# Patient Record
Sex: Male | Born: 1950 | Race: White | Hispanic: No | Marital: Married | Smoking: Never smoker
Health system: Southern US, Community
[De-identification: ages and names within clinical notes are randomized; demographics above are authoritative.]

## PROBLEM LIST (undated history)

## (undated) DIAGNOSIS — M1990 Unspecified osteoarthritis, unspecified site: Secondary | ICD-10-CM

## (undated) DIAGNOSIS — M109 Gout, unspecified: Secondary | ICD-10-CM

## (undated) DIAGNOSIS — E7800 Pure hypercholesterolemia, unspecified: Secondary | ICD-10-CM

## (undated) DIAGNOSIS — I10 Essential (primary) hypertension: Secondary | ICD-10-CM

## (undated) HISTORY — PX: VASECTOMY: SHX75

## (undated) HISTORY — PX: COLONOSCOPY: SHX174

---

## 2004-02-27 ENCOUNTER — Ambulatory Visit (HOSPITAL_COMMUNITY): Admission: RE | Admit: 2004-02-27 | Discharge: 2004-02-27 | Payer: Self-pay | Admitting: Internal Medicine

## 2009-11-29 ENCOUNTER — Ambulatory Visit (HOSPITAL_COMMUNITY): Admission: RE | Admit: 2009-11-29 | Discharge: 2009-11-29 | Payer: Self-pay | Admitting: Internal Medicine

## 2010-11-14 NOTE — Op Note (Signed)
NAME:  Anthony English, Anthony English                             ACCOUNT NO.:  [id number]   MEDICAL RECORD NO.:  [id number]                   PATIENT TYPE:  AMB   LOCATION:  DAY                                  FACILITY:  [facility]   PHYSICIAN:  Lionel December, M.D.                 DATE OF BIRTH:  28-Jan-1951   DATE OF PROCEDURE:  02/27/2004  DATE OF DISCHARGE:                                 OPERATIVE REPORT   PROCEDURE:  Total colonoscopy.   INDICATIONS:  Anthony English is a 60 year old Caucasian male who is here for screening  colonoscopy. Family history is negative for colorectal carcinoma. Procedure  and risks were reviewed with the patient and informed consent was obtained.   PREOPERATIVE MEDICATIONS:  Demerol 50 mg IV, Versed 5 mg IV in divided  doses.   FINDINGS:  Procedure performed in endoscopy suite. The patient's vital signs  and O2 saturations were monitored during procedure and remained stable. The  patient was placed in the left lateral position and rectal examination  performed. No abnormality noted on external or digital exam. Olympus video  scope was placed in the rectum and advanced into sigmoid colon and beyond.  Preparation was excellent. Few tiny diverticula were noted at the sigmoid  colon along with a small polyp which was ablated by cold biopsy. Scope was  advanced to the cecum which was identified by appendiceal orifice and  ileocecal valve. Picture taken for the record. As the scope was withdrawn,  colonic mucosa was once again carefully examined, and there were no other  abnormalities. The rectal mucosa similarly was normal. Scope was retroflexed  to examine anorectal junction, and small hemorrhoids were noted below the  dentate line. The scope was straightened and withdrawn. The patient  tolerated the procedure well.   FINAL DIAGNOSES:  1.  Small polyp ablated by cold biopsy from the sigmoid colon.  2.  Few tiny diverticula at the sigmoid colon and external hemorrhoids.   RECOMMENDATIONS:  1.  High fiber diet.  2.  I will be contacting patient with biopsy results and further      recommendations.      ___________________________________________                                            Lionel December, M.D.   NR/MEDQ  D:  02/27/2004  T:  02/27/2004  Job:  [id number]   cc:   Kingsley Callander. Ouida Sills, M.D.  [address]  [place]  [place] [address]  Fax: [phone]

## 2011-08-17 ENCOUNTER — Ambulatory Visit (HOSPITAL_COMMUNITY)
Admission: RE | Admit: 2011-08-17 | Discharge: 2011-08-17 | Disposition: A | Discharge status: Home / Self Care | Payer: BC Managed Care – PPO | Source: Ambulatory Visit | Attending: Internal Medicine | Admitting: Internal Medicine

## 2011-08-17 ENCOUNTER — Other Ambulatory Visit (HOSPITAL_COMMUNITY): Payer: Self-pay | Admitting: Internal Medicine

## 2011-08-17 DIAGNOSIS — Z77090 Contact with and (suspected) exposure to asbestos: Secondary | ICD-10-CM

## 2011-08-17 DIAGNOSIS — R918 Other nonspecific abnormal finding of lung field: Secondary | ICD-10-CM | POA: Insufficient documentation

## 2015-02-18 ENCOUNTER — Ambulatory Visit (HOSPITAL_COMMUNITY)
Admission: RE | Admit: 2015-02-18 | Discharge: 2015-02-18 | Disposition: A | Discharge status: Home / Self Care | Payer: Medicare Other | Source: Ambulatory Visit | Attending: Internal Medicine | Admitting: Internal Medicine

## 2015-02-18 ENCOUNTER — Other Ambulatory Visit (HOSPITAL_COMMUNITY): Payer: Self-pay | Admitting: Internal Medicine

## 2015-02-18 DIAGNOSIS — M25552 Pain in left hip: Secondary | ICD-10-CM | POA: Diagnosis not present

## 2015-02-18 DIAGNOSIS — R1032 Left lower quadrant pain: Secondary | ICD-10-CM

## 2015-02-18 DIAGNOSIS — R1030 Lower abdominal pain, unspecified: Secondary | ICD-10-CM | POA: Diagnosis present

## 2015-06-19 ENCOUNTER — Other Ambulatory Visit (HOSPITAL_COMMUNITY): Payer: Self-pay | Admitting: Internal Medicine

## 2015-06-19 DIAGNOSIS — R310 Gross hematuria: Secondary | ICD-10-CM

## 2015-06-20 ENCOUNTER — Ambulatory Visit (HOSPITAL_COMMUNITY)
Admission: RE | Admit: 2015-06-20 | Discharge: 2015-06-20 | Disposition: A | Discharge status: Home / Self Care | Payer: Medicare Other | Source: Ambulatory Visit | Attending: Internal Medicine | Admitting: Internal Medicine

## 2015-06-20 DIAGNOSIS — K5730 Diverticulosis of large intestine without perforation or abscess without bleeding: Secondary | ICD-10-CM | POA: Insufficient documentation

## 2015-06-20 DIAGNOSIS — K760 Fatty (change of) liver, not elsewhere classified: Secondary | ICD-10-CM | POA: Diagnosis not present

## 2015-06-20 DIAGNOSIS — R310 Gross hematuria: Secondary | ICD-10-CM | POA: Diagnosis not present

## 2015-06-20 DIAGNOSIS — N400 Enlarged prostate without lower urinary tract symptoms: Secondary | ICD-10-CM | POA: Insufficient documentation

## 2015-06-20 LAB — POCT I-STAT CREATININE: Creatinine, Ser: 1 mg/dL (ref 0.61–1.24)

## 2015-06-20 MED ORDER — IOHEXOL 300 MG/ML  SOLN
150.0000 mL | Freq: Once | INTRAMUSCULAR | Status: AC | PRN
  Administered 2015-06-20: 150 mL via INTRAVENOUS

## 2015-12-19 ENCOUNTER — Encounter (INDEPENDENT_AMBULATORY_CARE_PROVIDER_SITE_OTHER): Payer: Self-pay | Admitting: *Deleted

## 2016-01-02 ENCOUNTER — Encounter (INDEPENDENT_AMBULATORY_CARE_PROVIDER_SITE_OTHER): Payer: Self-pay | Admitting: *Deleted

## 2016-01-02 ENCOUNTER — Encounter (INDEPENDENT_AMBULATORY_CARE_PROVIDER_SITE_OTHER): Payer: Self-pay

## 2016-01-03 ENCOUNTER — Other Ambulatory Visit (INDEPENDENT_AMBULATORY_CARE_PROVIDER_SITE_OTHER): Payer: Self-pay | Admitting: *Deleted

## 2016-01-03 DIAGNOSIS — Z86010 Personal history of colonic polyps: Secondary | ICD-10-CM

## 2016-03-05 ENCOUNTER — Telehealth (INDEPENDENT_AMBULATORY_CARE_PROVIDER_SITE_OTHER): Payer: Self-pay | Admitting: *Deleted

## 2016-03-05 ENCOUNTER — Encounter (INDEPENDENT_AMBULATORY_CARE_PROVIDER_SITE_OTHER): Payer: Self-pay | Admitting: *Deleted

## 2016-03-05 NOTE — Telephone Encounter (Signed)
Patient needs trilyte 

## 2016-03-05 NOTE — Telephone Encounter (Signed)
Referring MD/PCP: fagan   Procedure: tcs  Reason/Indication:  Hx polyps  Has patient had this procedure before?  Yes, 2010 -- scanned   If so, when, by whom and where?    Is there a family history of colon cancer?  no  Who?  What age when diagnosed?    Is patient diabetic?   no      Does patient have prosthetic heart valve or mechanical valve?  no  Do you have a pacemaker?  no  Has patient ever had endocarditis? no  Has patient had joint replacement within last 12 months?  no  Does patient tend to be constipated or take laxatives? no  Does patient have a history of alcohol/drug use?  no  Is patient on Coumadin, Plavix and/or Aspirin? no  Medications: lisinopril 20 mg 1/2 tab daily, allopurinol 300 mg 1 1/2 tab daily, pravastatin 40 mg daily  Allergies: nkda  Medication Adjustment:   Procedure date & time: 04/02/16 at 1230

## 2016-03-06 MED ORDER — PEG 3350-KCL-NA BICARB-NACL 420 G PO SOLR: 4000.0000 mL | Freq: Once | ORAL | 0 refills | Status: AC

## 2016-03-06 NOTE — Telephone Encounter (Signed)
agree

## 2016-04-02 ENCOUNTER — Ambulatory Visit (HOSPITAL_COMMUNITY)
Admission: RE | Admit: 2016-04-02 | Discharge: 2016-04-02 | Disposition: A | Discharge status: Home / Self Care | Payer: Medicare Other | Source: Ambulatory Visit | Attending: Internal Medicine | Admitting: Internal Medicine

## 2016-04-02 ENCOUNTER — Encounter (HOSPITAL_COMMUNITY): Payer: Self-pay | Admitting: *Deleted

## 2016-04-02 ENCOUNTER — Encounter (HOSPITAL_COMMUNITY)
Admission: RE | Disposition: A | Discharge status: Home / Self Care | Payer: Self-pay | Source: Ambulatory Visit | Attending: Internal Medicine

## 2016-04-02 DIAGNOSIS — K644 Residual hemorrhoidal skin tags: Secondary | ICD-10-CM | POA: Diagnosis not present

## 2016-04-02 DIAGNOSIS — Z09 Encounter for follow-up examination after completed treatment for conditions other than malignant neoplasm: Secondary | ICD-10-CM | POA: Diagnosis not present

## 2016-04-02 DIAGNOSIS — Z1211 Encounter for screening for malignant neoplasm of colon: Secondary | ICD-10-CM | POA: Diagnosis present

## 2016-04-02 DIAGNOSIS — E7800 Pure hypercholesterolemia, unspecified: Secondary | ICD-10-CM | POA: Insufficient documentation

## 2016-04-02 DIAGNOSIS — D125 Benign neoplasm of sigmoid colon: Secondary | ICD-10-CM | POA: Diagnosis not present

## 2016-04-02 DIAGNOSIS — Z86010 Personal history of colonic polyps: Secondary | ICD-10-CM | POA: Insufficient documentation

## 2016-04-02 DIAGNOSIS — D123 Benign neoplasm of transverse colon: Secondary | ICD-10-CM | POA: Insufficient documentation

## 2016-04-02 DIAGNOSIS — K5730 Diverticulosis of large intestine without perforation or abscess without bleeding: Secondary | ICD-10-CM | POA: Diagnosis not present

## 2016-04-02 DIAGNOSIS — I10 Essential (primary) hypertension: Secondary | ICD-10-CM | POA: Insufficient documentation

## 2016-04-02 DIAGNOSIS — K648 Other hemorrhoids: Secondary | ICD-10-CM

## 2016-04-02 HISTORY — DX: Pure hypercholesterolemia, unspecified: E78.00

## 2016-04-02 HISTORY — DX: Gout, unspecified: M10.9

## 2016-04-02 HISTORY — DX: Essential (primary) hypertension: I10

## 2016-04-02 HISTORY — PX: COLONOSCOPY: SHX5424

## 2016-04-02 SURGERY — COLONOSCOPY
Anesthesia: Moderate Sedation

## 2016-04-02 MED ORDER — MIDAZOLAM HCL 5 MG/5ML IJ SOLN
INTRAMUSCULAR | Status: DC | PRN
  Administered 2016-04-02 (×3): 2 mg via INTRAVENOUS

## 2016-04-02 MED ORDER — MEPERIDINE HCL 50 MG/ML IJ SOLN
INTRAMUSCULAR | Status: AC
  Filled 2016-04-02: qty 1

## 2016-04-02 MED ORDER — SODIUM CHLORIDE 0.9 % IV SOLN
INTRAVENOUS | Status: DC
  Administered 2016-04-02: 12:00:00 via INTRAVENOUS

## 2016-04-02 MED ORDER — MIDAZOLAM HCL 5 MG/5ML IJ SOLN
INTRAMUSCULAR | Status: DC
Start: 2016-04-02 — End: 2016-04-02
  Filled 2016-04-02: qty 10

## 2016-04-02 MED ORDER — MEPERIDINE HCL 50 MG/ML IJ SOLN
INTRAMUSCULAR | Status: DC | PRN
  Administered 2016-04-02 (×2): 25 mg via INTRAVENOUS

## 2016-04-02 NOTE — Discharge Instructions (Signed)
No Aspirin or NSAIDs for 3 days. Resume usual medications and high fiber diet. No driving for 24 hours. Physician will call with biopsy results    Colonoscopy, Care After These instructions give you information on caring for yourself after your procedure. Your doctor may also give you more specific instructions. Call your doctor if you have any problems or questions after your procedure. HOME CARE  Do not drive for 24 hours.  Do not sign important papers or use machinery for 24 hours.  You may shower.  You may go back to your usual activities, but go slower for the first 24 hours.  Take rest breaks often during the first 24 hours.  Walk around or use warm packs on your belly (abdomen) if you have belly cramping or gas.  Drink enough fluids to keep your pee (urine) clear or pale yellow.  Resume your normal diet. Avoid heavy or fried foods.  Avoid drinking alcohol for 24 hours or as told by your doctor.  Only take medicines as told by your doctor. If a tissue sample (biopsy) was taken during the procedure:   Do not take aspirin or blood thinners for 7 days, or as told by your doctor.  Do not drink alcohol for 7 days, or as told by your doctor.  Eat soft foods for the first 24 hours. GET HELP IF: You still have a small amount of blood in your poop (stool) 2-3 days after the procedure. GET HELP RIGHT AWAY IF:  You have more than a small amount of blood in your poop.  You see clumps of tissue (blood clots) in your poop.  Your belly is puffy (swollen).  You feel sick to your stomach (nauseous) or throw up (vomit).  You have a fever.  You have belly pain that gets worse and medicine does not help. MAKE SURE YOU:  Understand these instructions.  Will watch your condition.  Will get help right away if you are not doing well or get worse.   This information is not intended to replace advice given to you by your health care provider. Make sure you discuss any  questions you have with your health care provider.   Document Released: 07/18/2010 Document Revised: 06/20/2013 Document Reviewed: 02/20/2013 Elsevier Interactive Patient Education 2016 [employer]  Colon Polyps Polyps are lumps of extra tissue growing inside the body. Polyps can grow in the large intestine (colon). Most colon polyps are noncancerous (benign). However, some colon polyps can become cancerous over time. Polyps that are larger than a pea may be harmful. To be safe, caregivers remove and test all polyps. CAUSES  Polyps form when mutations in the genes cause your cells to grow and divide even though no more tissue is needed. RISK FACTORS There are a number of risk factors that can increase your chances of getting colon polyps. They include:  Being older than 50 years.  Family history of colon polyps or colon cancer.  Long-term colon diseases, such as colitis or Crohn disease.  Being overweight.  Smoking.  Being inactive.  Drinking too much alcohol. SYMPTOMS  Most small polyps do not cause symptoms. If symptoms are present, they may include:  Blood in the stool. The stool may look dark red or black.  Constipation or diarrhea that lasts longer than 1 week. DIAGNOSIS People often do not know they have polyps until their caregiver finds them during a regular checkup. Your caregiver can use 4 tests to check for polyps:  Digital rectal exam.  The caregiver wears gloves and feels inside the rectum. This test would find polyps only in the rectum.  Barium enema. The caregiver puts a liquid called barium into your rectum before taking X-rays of your colon. Barium makes your colon look white. Polyps are dark, so they are easy to see in the X-ray pictures.  Sigmoidoscopy. A thin, flexible tube (sigmoidoscope) is placed into your rectum. The sigmoidoscope has a light and tiny camera in it. The caregiver uses the sigmoidoscope to look at the last third of your  colon.  Colonoscopy. This test is like sigmoidoscopy, but the caregiver looks at the entire colon. This is the most common method for finding and removing polyps. TREATMENT  Any polyps will be removed during a sigmoidoscopy or colonoscopy. The polyps are then tested for cancer. PREVENTION  To help lower your risk of getting more colon polyps:  Eat plenty of fruits and vegetables. Avoid eating fatty foods.  Do not smoke.  Avoid drinking alcohol.  Exercise every day.  Lose weight if recommended by your caregiver.  Eat plenty of calcium and folate. Foods that are rich in calcium include milk, cheese, and broccoli. Foods that are rich in folate include chickpeas, kidney beans, and spinach. HOME CARE INSTRUCTIONS Keep all follow-up appointments as directed by your caregiver. You may need periodic exams to check for polyps. SEEK MEDICAL CARE IF: You notice bleeding during a bowel movement.   This information is not intended to replace advice given to you by your health care provider. Make sure you discuss any questions you have with your health care provider.   Document Released: 03/11/2004 Document Revised: 07/06/2014 Document Reviewed: 08/25/2011 Elsevier Interactive Patient Education 2016 [employer].   Hemorrhoids Hemorrhoids are swollen veins around the rectum or anus. There are two types of hemorrhoids:   Internal hemorrhoids. These occur in the veins just inside the rectum. They may poke through to the outside and become irritated and painful.  External hemorrhoids. These occur in the veins outside the anus and can be felt as a painful swelling or hard lump near the anus. CAUSES  Pregnancy.   Obesity.   Constipation or diarrhea.   Straining to have a bowel movement.   Sitting for long periods on the toilet.  Heavy lifting or other activity that caused you to strain.  Anal intercourse. SYMPTOMS   Pain.   Anal itching or irritation.   Rectal bleeding.    Fecal leakage.   Anal swelling.   One or more lumps around the anus.  DIAGNOSIS  Your caregiver may be able to diagnose hemorrhoids by visual examination. Other examinations or tests that may be performed include:   Examination of the rectal area with a gloved hand (digital rectal exam).   Examination of anal canal using a small tube (scope).   A blood test if you have lost a significant amount of blood.  A test to look inside the colon (sigmoidoscopy or colonoscopy). TREATMENT Most hemorrhoids can be treated at home. However, if symptoms do not seem to be getting better or if you have a lot of rectal bleeding, your caregiver may perform a procedure to help make the hemorrhoids get smaller or remove them completely. Possible treatments include:   Placing a rubber band at the base of the hemorrhoid to cut off the circulation (rubber band ligation).   Injecting a chemical to shrink the hemorrhoid (sclerotherapy).   Using a tool to burn the hemorrhoid (infrared light therapy).   Surgically removing  the hemorrhoid (hemorrhoidectomy).   Stapling the hemorrhoid to block blood flow to the tissue (hemorrhoid stapling).  HOME CARE INSTRUCTIONS   Eat foods with fiber, such as whole grains, beans, nuts, fruits, and vegetables. Ask your doctor about taking products with added fiber in them (fibersupplements).  Increase fluid intake. Drink enough water and fluids to keep your urine clear or pale yellow.   Exercise regularly.   Go to the bathroom when you have the urge to have a bowel movement. Do not wait.   Avoid straining to have bowel movements.   Keep the anal area dry and clean. Use wet toilet paper or moist towelettes after a bowel movement.   Medicated creams and suppositories may be used or applied as directed.   Only take over-the-counter or prescription medicines as directed by your caregiver.   Take warm sitz baths for 15-20 minutes, 3-4 times a day  to ease pain and discomfort.   Place ice packs on the hemorrhoids if they are tender and swollen. Using ice packs between sitz baths may be helpful.   Put ice in a plastic bag.   Place a towel between your skin and the bag.   Leave the ice on for 15-20 minutes, 3-4 times a day.   Do not use a donut-shaped pillow or sit on the toilet for long periods. This increases blood pooling and pain.  SEEK MEDICAL CARE IF:  You have increasing pain and swelling that is not controlled by treatment or medicine.  You have uncontrolled bleeding.  You have difficulty or you are unable to have a bowel movement.  You have pain or inflammation outside the area of the hemorrhoids. MAKE SURE YOU:  Understand these instructions.  Will watch your condition.  Will get help right away if you are not doing well or get worse.   This information is not intended to replace advice given to you by your health care provider. Make sure you discuss any questions you have with your health care provider.   Document Released: 06/12/2000 Document Revised: 06/01/2012 Document Reviewed: 04/19/2012 Elsevier Interactive Patient Education 2016 [employer].    Diverticulosis Diverticulosis is the condition that develops when small pouches (diverticula) form in the wall of your colon. Your colon, or large intestine, is where water is absorbed and stool is formed. The pouches form when the inside layer of your colon pushes through weak spots in the outer layers of your colon. CAUSES  No one knows exactly what causes diverticulosis. RISK FACTORS  Being older than 65. Your risk for this condition increases with age. Diverticulosis is rare in people younger than 40 years. By age 61, almost everyone has it.  Eating a low-fiber diet.  Being frequently constipated.  Being overweight.  Not getting enough exercise.  Smoking.  Taking over-the-counter pain medicines, like aspirin and ibuprofen. SYMPTOMS  Most  people with diverticulosis do not have symptoms. DIAGNOSIS  Because diverticulosis often has no symptoms, health care providers often discover the condition during an exam for other colon problems. In many cases, a health care provider will diagnose diverticulosis while using a flexible scope to examine the colon (colonoscopy). TREATMENT  If you have never developed an infection related to diverticulosis, you may not need treatment. If you have had an infection before, treatment may include:  Eating more fruits, vegetables, and grains.  Taking a fiber supplement.  Taking a live bacteria supplement (probiotic).  Taking medicine to relax your colon. HOME CARE INSTRUCTIONS   Drink  at least 6-8 glasses of water each day to prevent constipation.  Try not to strain when you have a bowel movement.  Keep all follow-up appointments. If you have had an infection before:  Increase the fiber in your diet as directed by your health care provider or dietitian.  Take a dietary fiber supplement if your health care provider approves.  Only take medicines as directed by your health care provider. SEEK MEDICAL CARE IF:   You have abdominal pain.  You have bloating.  You have cramps.  You have not gone to the bathroom in 3 days. SEEK IMMEDIATE MEDICAL CARE IF:   Your pain gets worse.  Yourbloating becomes very bad.  You have a fever or chills, and your symptoms suddenly get worse.  You begin vomiting.  You have bowel movements that are bloody or black. MAKE SURE YOU:  Understand these instructions.  Will watch your condition.  Will get help right away if you are not doing well or get worse.   This information is not intended to replace advice given to you by your health care provider. Make sure you discuss any questions you have with your health care provider.   Document Released: 03/12/2004 Document Revised: 06/20/2013 Document Reviewed: 05/10/2013 Elsevier Interactive  Patient Education [employer].

## 2016-04-02 NOTE — H&P (Signed)
Anthony English is an 65 y.o. male.   Chief Complaint: Patient is  here for colonoscopy. HPI: Patient is 66 year old Caucasian male with history of colonic adenomas and is here for surveillance colonoscopy. Last exam was about 7 years ago. He denies abdominal pain change in bowel habits or rectal bleeding. History is negative for CRC.  Past Medical History:  Diagnosis Date  . Gout   . Hypercholesteremia   . Hypertension     Past Surgical History:  Procedure Laterality Date  . COLONOSCOPY    . COLONOSCOPY    . VASECTOMY      Family History  Problem Relation Age of Onset  . Colon cancer Neg Hx    Social History:  reports that he has never smoked. He quit smokeless tobacco use about 26 years ago. He reports that he drinks alcohol. He reports that he does not use drugs.  Allergies: No Known Allergies  Medications Prior to Admission  Medication Sig Dispense Refill  . allopurinol (ZYLOPRIM) 100 MG tablet Take 100 mg by mouth daily.    Marland Kitchen lisinopril (PRINIVIL,ZESTRIL) 10 MG tablet Take 10 mg by mouth daily.    . pravastatin (PRAVACHOL) 20 MG tablet Take 20 mg by mouth daily.      No results found for this or any previous visit (from the past 48 hour(s)). No results found.  ROS  Blood pressure 114/81, pulse 75, temperature 98 F (36.7 C), temperature source Oral, resp. rate 18, height 6' (1.829 m), weight 241 lb (109.3 kg), SpO2 93 %. Physical Exam  Constitutional: He appears well-developed and well-nourished.  HENT:  Mouth/Throat: Oropharynx is clear and moist.  Eyes: Conjunctivae are normal. No scleral icterus.  Neck: No thyromegaly present.  Cardiovascular: Normal rate, regular rhythm and normal heart sounds.   No murmur heard. Respiratory: Effort normal and breath sounds normal.  GI: Soft. He exhibits no distension and no mass. There is no tenderness.  Musculoskeletal: He exhibits no edema.  Lymphadenopathy:    He has no cervical adenopathy.  Neurological: He is alert.   Skin: Skin is warm and dry.     Assessment/Plan History of colonic polyps. Surveillance colonoscopy.  Hildred Laser, MD 04/02/2016, 1:07 PM

## 2016-04-02 NOTE — Op Note (Signed)
[facility] Patient Name: Anthony English Procedure Date: 04/02/2016 12:51 PM MRN: [id number] Date of Birth: 1950-12-17 Attending MD: Hildred Laser , MD CSN: [id number] Age: 65 Admit Type: Outpatient Procedure:                Colonoscopy Indications:              High risk colon cancer surveillance: Personal                            history of colonic polyps Providers:                Hildred Laser, MD, Jeralyn Bennett, RN, Bonnetta Barry, Technician Referring MD:             Asencion Noble, MD Medicines:                Meperidine 50 mg IV, Midazolam 6 mg IV Complications:            No immediate complications. Estimated Blood Loss:     Estimated blood loss was minimal. Procedure:                Pre-Anesthesia Assessment:                           - Prior to the procedure, a History and Physical                            was performed, and patient medications and                            allergies were reviewed. The patient's tolerance of                            previous anesthesia was also reviewed. The risks                            and benefits of the procedure and the sedation                            options and risks were discussed with the patient.                            All questions were answered, and informed consent                            was obtained. Prior Anticoagulants: The patient has                            taken no previous anticoagulant or antiplatelet                            agents. ASA Grade Assessment: II - A patient with  mild systemic disease. After reviewing the risks                            and benefits, the patient was deemed in                            satisfactory condition to undergo the procedure.                           After obtaining informed consent, the colonoscope                            was passed under direct vision. Throughout the                            procedure,  the patient's blood pressure, pulse, and                            oxygen saturations were monitored continuously. The                            [id number]) scope was introduced through                            the anus and advanced to the the cecum, identified                            by appendiceal orifice and ileocecal valve. The                            colonoscopy was performed without difficulty. The                            patient tolerated the procedure well. The quality                            of the bowel preparation was adequate. The                            ileocecal valve, appendiceal orifice, and rectum                            were photographed. Scope In: 1:14:11 PM Scope Out: 1:47:13 PM Scope Withdrawal Time: 0 hours 22 minutes 35 seconds  Total Procedure Duration: 0 hours 33 minutes 2 seconds  Findings:      Two sessile polyps were found in the transverse colon. The polyps were       small in size. These were biopsied with a cold forceps for histology.       The pathology specimen was placed into Bottle Number 1.      A 7 mm polyp was found in the splenic flexure. The polyp was sessile.       The polyp was removed with a cold snare. Resection and retrieval were       complete. The pathology specimen  was placed into Bottle Number 1.      A small polyp was found in the sigmoid colon. The polyp was sessile. The       polyp was removed with a cold snare. Polyp resection was incomplete. The       resected tissue was retrieved. To close a defect after polypectomy, one       hemostatic clip was successfully placed (MR conditional). There was no       bleeding at the end of the procedure.      Multiple medium-mouthed diverticula were found in the proximal sigmoid       colon, mid sigmoid colon and distal sigmoid colon.      External and internal hemorrhoids were found during retroflexion. The       hemorrhoids were small. Impression:               -  Two small polyps in the transverse colon.                            Biopsied.                           - One 7 mm polyp at the splenic flexure, removed                            with a cold snare. Resected and retrieved.                           - One small polyp in the sigmoid colon, removed                            with a cold snare. Polyp felt to beoverlying                            diverticulum. Incomplete resection. Resected tissue                            retrieved. Clip (MR conditional) was placed.                           - Diverticulosis in the proximal sigmoid colon, in                            the mid sigmoid colon and in the distal sigmoid                            colon.                           - External and internal hemorrhoids. Moderate Sedation:      Moderate (conscious) sedation was administered by the endoscopy nurse       and supervised by the endoscopist. The following parameters were       monitored: oxygen saturation, heart rate, blood pressure, CO2       capnography and response to care. Total physician intraservice time was       37 minutes. Recommendation:           -  Patient has a contact number available for                            emergencies. The signs and symptoms of potential                            delayed complications were discussed with the                            patient. Return to normal activities tomorrow.                            Written discharge instructions were provided to the                            patient.                           - High fiber diet today.                           - Continue present medications.                           - No aspirin, ibuprofen, naproxen, or other                            non-steroidal anti-inflammatory drugs for 3 days                            after polyp removal.                           - Await pathology results.                           - Repeat colonoscopy for  surveillance based on                            pathology results. Procedure Code(s):        --- Professional ---                           [phone], Colonoscopy, flexible; with removal of                            tumor(s), polyp(s), or other lesion(s) by snare                            technique                           [id number], 59, Colonoscopy, flexible; with biopsy,                            single or multiple  [id number], Moderate sedation services provided by the                            same physician or other qualified health care                            professional performing the diagnostic or                            therapeutic service that the sedation supports,                            requiring the presence of an independent trained                            observer to assist in the monitoring of the                            patient's level of consciousness and physiological                            status; initial 15 minutes of intraservice time,                            patient age 50 years or older                           [phone], Moderate sedation services; each additional                            15 minutes intraservice time Diagnosis Code(s):        --- Professional ---                           Z86.010, Personal history of colonic polyps                           D12.3, Benign neoplasm of transverse colon (hepatic                            flexure or splenic flexure)                           D12.5, Benign neoplasm of sigmoid colon                           K64.8, Other hemorrhoids                           K57.30, Diverticulosis of large intestine without                            perforation or abscess without bleeding CPT copyright 2016 American Medical Association. All rights reserved. The codes documented in this report are preliminary and upon coder review may  be revised to meet current compliance  requirements. Hildred Laser, MD Hildred Laser, MD 04/02/2016 1:58:52 PM This report has been signed electronically. Number of Addenda: 0

## 2016-04-07 ENCOUNTER — Encounter (HOSPITAL_COMMUNITY): Payer: Self-pay | Admitting: Internal Medicine

## 2016-04-30 NOTE — Progress Notes (Signed)
Need orders in epic for 12-1- surgery thanks

## 2016-05-11 ENCOUNTER — Other Ambulatory Visit (INDEPENDENT_AMBULATORY_CARE_PROVIDER_SITE_OTHER): Payer: Self-pay | Admitting: Physician Assistant

## 2016-05-19 ENCOUNTER — Encounter (HOSPITAL_COMMUNITY)
Admission: RE | Admit: 2016-05-19 | Discharge: 2016-05-19 | Disposition: A | Discharge status: Home / Self Care | Payer: Medicare Other | Source: Ambulatory Visit | Attending: Orthopaedic Surgery | Admitting: Orthopaedic Surgery

## 2016-05-19 ENCOUNTER — Encounter (HOSPITAL_COMMUNITY): Payer: Self-pay

## 2016-05-19 ENCOUNTER — Encounter (INDEPENDENT_AMBULATORY_CARE_PROVIDER_SITE_OTHER): Payer: Self-pay

## 2016-05-19 DIAGNOSIS — Z01818 Encounter for other preprocedural examination: Secondary | ICD-10-CM | POA: Diagnosis not present

## 2016-05-19 DIAGNOSIS — M1612 Unilateral primary osteoarthritis, left hip: Secondary | ICD-10-CM | POA: Diagnosis not present

## 2016-05-19 DIAGNOSIS — I10 Essential (primary) hypertension: Secondary | ICD-10-CM | POA: Diagnosis not present

## 2016-05-19 HISTORY — DX: Unspecified osteoarthritis, unspecified site: M19.90

## 2016-05-19 LAB — CBC
HCT: 48.3 % (ref 39.0–52.0)
Hemoglobin: 17 g/dL (ref 13.0–17.0)
MCH: 31.6 pg (ref 26.0–34.0)
MCHC: 35.2 g/dL (ref 30.0–36.0)
MCV: 89.8 fL (ref 78.0–100.0)
PLATELETS: 210 10*3/uL (ref 150–400)
RBC: 5.38 MIL/uL (ref 4.22–5.81)
RDW: 13.4 % (ref 11.5–15.5)
WBC: 7.3 10*3/uL (ref 4.0–10.5)

## 2016-05-19 LAB — BASIC METABOLIC PANEL
Anion gap: 7 (ref 5–15)
BUN: 16 mg/dL (ref 6–20)
CHLORIDE: 105 mmol/L (ref 101–111)
CO2: 28 mmol/L (ref 22–32)
CREATININE: 1.13 mg/dL (ref 0.61–1.24)
Calcium: 9.9 mg/dL (ref 8.9–10.3)
GFR calc Af Amer: >60 mL/min (ref >60)
Glucose, Bld: 108 mg/dL — ABNORMAL HIGH (ref 65–99)
Potassium: 5.5 mmol/L — ABNORMAL HIGH (ref 3.5–5.1)
SODIUM: 140 mmol/L (ref 135–145)

## 2016-05-19 LAB — ABO/RH: ABO/RH(D): B POS

## 2016-05-19 LAB — SURGICAL PCR SCREEN
MRSA, PCR: NEGATIVE
Staphylococcus aureus: NEGATIVE

## 2016-05-19 NOTE — Patient Instructions (Addendum)
Anthony English  05/19/2016   Your procedure is scheduled on: 05/29/16  Report to [facility] Main  Entrance take [place]  elevators to 3rd floor to  [place] at [place]  AM.  Call this number if you have problems the morning of surgery [phone]   Remember: ONLY 1 PERSON MAY GO WITH YOU TO SHORT STAY TO GET  READY MORNING OF YOUR SURGERY.  Do not eat food or drink liquids :After Midnight.     Take these medicines the morning of surgery with A SIP OF WATER:Allurinol  DO NOT TAKE ANY DIABETIC MEDICATIONS DAY OF YOUR SURGERY                               You may not have any metal on your body including hair pins and              piercings  Do not wear jewelry, make-up, lotions, powders or perfumes, deodorant             Do not wear nail polish.  Do not shave  48 hours prior to surgery.              Men may shave face and neck.   Do not bring valuables to the hospital. [place].  Contacts, dentures or bridgework may not be worn into surgery.  Leave suitcase in the car. After surgery it may be brought to your room.               Please read over the following fact sheets you were given: _____________________________________________________________________             [facility] - Preparing for Surgery Before surgery, you can play an important role.  Because skin is not sterile, your skin needs to be as free of germs as possible.  You can reduce the number of germs on your skin by washing with CHG (chlorahexidine gluconate) soap before surgery.  CHG is an antiseptic cleaner which kills germs and bonds with the skin to continue killing germs even after washing. Please DO NOT use if you have an allergy to CHG or antibacterial soaps.  If your skin becomes reddened/irritated stop using the CHG and inform your nurse when you arrive at Short Stay. Do not shave (including legs and underarms) for at least 48 hours  prior to the first CHG shower.  You may shave your face/neck. Please follow these instructions carefully:  1.  Shower with CHG Soap the night before surgery and the  morning of Surgery.  2.  If you choose to wash your hair, wash your hair first as usual with your  normal  shampoo.  3.  After you shampoo, rinse your hair and body thoroughly to remove the  shampoo.                           4.  Use CHG as you would any other liquid soap.  You can apply chg directly  to the skin and wash                       Gently with a scrungie or clean washcloth.  5.  Apply the CHG Soap to your body ONLY FROM THE NECK DOWN.   Do not use on face/ open                           Wound or open sores. Avoid contact with eyes, ears mouth and genitals (private parts).                       Wash face,  Genitals (private parts) with your normal soap.             6.  Wash thoroughly, paying special attention to the area where your surgery  will be performed.  7.  Thoroughly rinse your body with warm water from the neck down.  8.  DO NOT shower/wash with your normal soap after using and rinsing off  the CHG Soap.                9.  Pat yourself dry with a clean towel.            10.  Wear clean pajamas.            11.  Place clean sheets on your bed the night of your first shower and do not  sleep with pets. Day of Surgery : Do not apply any lotions/deodorants the morning of surgery.  Please wear clean clothes to the hospital/surgery center.  FAILURE TO FOLLOW THESE INSTRUCTIONS MAY RESULT IN THE CANCELLATION OF YOUR SURGERY PATIENT SIGNATURE_________________________________  NURSE SIGNATURE__________________________________  ________________________________________________________________________   Adam Phenix  An incentive spirometer is a tool that can help keep your lungs clear and active. This tool measures how well you are filling your lungs with each breath. Taking long deep breaths may help reverse  or decrease the chance of developing breathing (pulmonary) problems (especially infection) following:  A long period of time when you are unable to move or be active. BEFORE THE PROCEDURE   If the spirometer includes an indicator to show your best effort, your nurse or respiratory therapist will set it to a desired goal.  If possible, sit up straight or lean slightly forward. Try not to slouch.  Hold the incentive spirometer in an upright position. INSTRUCTIONS FOR USE  1. Sit on the edge of your bed if possible, or sit up as far as you can in bed or on a chair. 2. Hold the incentive spirometer in an upright position. 3. Breathe out normally. 4. Place the mouthpiece in your mouth and seal your lips tightly around it. 5. Breathe in slowly and as deeply as possible, raising the piston or the ball toward the top of the column. 6. Hold your breath for 3-5 seconds or for as long as possible. Allow the piston or ball to fall to the bottom of the column. 7. Remove the mouthpiece from your mouth and breathe out normally. 8. Rest for a few seconds and repeat Steps 1 through 7 at least 10 times every 1-2 hours when you are awake. Take your time and take a few normal breaths between deep breaths. 9. The spirometer may include an indicator to show your best effort. Use the indicator as a goal to work toward during each repetition. 10. After each set of 10 deep breaths, practice coughing to be sure your lungs are clear. If you have an incision (the cut made at the time of surgery), support your incision when coughing by placing a  pillow or rolled up towels firmly against it. Once you are able to get out of bed, walk around indoors and cough well. You may stop using the incentive spirometer when instructed by your caregiver.  RISKS AND COMPLICATIONS  Take your time so you do not get dizzy or light-headed.  If you are in pain, you may need to take or ask for pain medication before doing incentive  spirometry. It is harder to take a deep breath if you are having pain. AFTER USE  Rest and breathe slowly and easily.  It can be helpful to keep track of a log of your progress. Your caregiver can provide you with a simple table to help with this. If you are using the spirometer at home, follow these instructions: [place] IF:   You are having difficultly using the spirometer.  You have trouble using the spirometer as often as instructed.  Your pain medication is not giving enough relief while using the spirometer.  You develop fever of 100.5 F (38.1 C) or higher. SEEK IMMEDIATE MEDICAL CARE IF:   You cough up bloody sputum that had not been present before.  You develop fever of 102 F (38.9 C) or greater.  You develop worsening pain at or near the incision site. MAKE SURE YOU:   Understand these instructions.  Will watch your condition.  Will get help right away if you are not doing well or get worse. Document Released: 10/26/2006 Document Revised: 09/07/2011 Document Reviewed: 12/27/2006 [facility] Patient Information 2014 [place], [place].   ________________________________________________________________________

## 2016-05-29 ENCOUNTER — Encounter (HOSPITAL_COMMUNITY)
Admission: RE | Disposition: A | Discharge status: Home Health | Payer: Self-pay | Source: Ambulatory Visit | Attending: Orthopaedic Surgery

## 2016-05-29 ENCOUNTER — Inpatient Hospital Stay (HOSPITAL_COMMUNITY): Payer: Medicare Other

## 2016-05-29 ENCOUNTER — Inpatient Hospital Stay (HOSPITAL_COMMUNITY)
Admission: RE | Admit: 2016-05-29 | Discharge: 2016-05-30 | DRG: 470 | Disposition: A | Discharge status: Home Health | Payer: Medicare Other | Source: Ambulatory Visit | Attending: Orthopaedic Surgery | Admitting: Orthopaedic Surgery

## 2016-05-29 ENCOUNTER — Inpatient Hospital Stay (HOSPITAL_COMMUNITY): Payer: Medicare Other | Admitting: Anesthesiology

## 2016-05-29 ENCOUNTER — Encounter (HOSPITAL_COMMUNITY): Payer: Self-pay | Admitting: *Deleted

## 2016-05-29 DIAGNOSIS — M1612 Unilateral primary osteoarthritis, left hip: Principal | ICD-10-CM

## 2016-05-29 DIAGNOSIS — Z419 Encounter for procedure for purposes other than remedying health state, unspecified: Secondary | ICD-10-CM

## 2016-05-29 DIAGNOSIS — Z6834 Body mass index (BMI) 34.0-34.9, adult: Secondary | ICD-10-CM | POA: Diagnosis not present

## 2016-05-29 DIAGNOSIS — Z79899 Other long term (current) drug therapy: Secondary | ICD-10-CM

## 2016-05-29 DIAGNOSIS — Z96642 Presence of left artificial hip joint: Secondary | ICD-10-CM

## 2016-05-29 DIAGNOSIS — Z87891 Personal history of nicotine dependence: Secondary | ICD-10-CM | POA: Diagnosis not present

## 2016-05-29 DIAGNOSIS — E7800 Pure hypercholesterolemia, unspecified: Secondary | ICD-10-CM | POA: Diagnosis present

## 2016-05-29 DIAGNOSIS — I10 Essential (primary) hypertension: Secondary | ICD-10-CM | POA: Diagnosis present

## 2016-05-29 DIAGNOSIS — M25552 Pain in left hip: Secondary | ICD-10-CM | POA: Diagnosis present

## 2016-05-29 HISTORY — PX: TOTAL HIP ARTHROPLASTY: SHX124

## 2016-05-29 LAB — TYPE AND SCREEN
ABO/RH(D): B POS
ANTIBODY SCREEN: NEGATIVE

## 2016-05-29 SURGERY — ARTHROPLASTY, HIP, TOTAL, ANTERIOR APPROACH
Anesthesia: Spinal | Site: Hip | Laterality: Left

## 2016-05-29 MED ORDER — SODIUM CHLORIDE 0.9 % IR SOLN
Status: DC | PRN
  Administered 2016-05-29: 1000 mL

## 2016-05-29 MED ORDER — LACTATED RINGERS IV SOLN
INTRAVENOUS | Status: DC
  Administered 2016-05-29 (×2): via INTRAVENOUS

## 2016-05-29 MED ORDER — BUPIVACAINE HCL (PF) 0.5 % IJ SOLN
INTRAMUSCULAR | Status: DC | PRN
  Administered 2016-05-29: 15 mg via INTRATHECAL

## 2016-05-29 MED ORDER — PHENOL 1.4 % MT LIQD: 1.0000 | OROMUCOSAL | Status: DC | PRN

## 2016-05-29 MED ORDER — CEFAZOLIN IN D5W 1 GM/50ML IV SOLN
1.0000 g | Freq: Four times a day (QID) | INTRAVENOUS | Status: AC
  Administered 2016-05-29 (×2): 1 g via INTRAVENOUS
  Filled 2016-05-29 (×3): qty 50

## 2016-05-29 MED ORDER — OXYCODONE HCL 5 MG PO TABS: 5.0000 mg | ORAL_TABLET | Freq: Once | ORAL | Status: DC | PRN

## 2016-05-29 MED ORDER — METOCLOPRAMIDE HCL 5 MG/ML IJ SOLN: 5.0000 mg | Freq: Three times a day (TID) | INTRAMUSCULAR | Status: DC | PRN

## 2016-05-29 MED ORDER — DEXAMETHASONE SODIUM PHOSPHATE 10 MG/ML IJ SOLN
INTRAMUSCULAR | Status: AC
  Filled 2016-05-29: qty 1

## 2016-05-29 MED ORDER — BUPIVACAINE HCL (PF) 0.5 % IJ SOLN
INTRAMUSCULAR | Status: AC
Start: 2016-05-29 — End: 2016-05-29
  Filled 2016-05-29: qty 30

## 2016-05-29 MED ORDER — PRAVASTATIN SODIUM 20 MG PO TABS
40.0000 mg | ORAL_TABLET | Freq: Every day | ORAL | Status: DC
  Administered 2016-05-30: 40 mg via ORAL
  Filled 2016-05-29: qty 2

## 2016-05-29 MED ORDER — METOCLOPRAMIDE HCL 5 MG PO TABS: 5.0000 mg | ORAL_TABLET | Freq: Three times a day (TID) | ORAL | Status: DC | PRN

## 2016-05-29 MED ORDER — MIDAZOLAM HCL 2 MG/2ML IJ SOLN
INTRAMUSCULAR | Status: AC
  Filled 2016-05-29: qty 2

## 2016-05-29 MED ORDER — POLYETHYLENE GLYCOL 3350 17 G PO PACK: 17.0000 g | PACK | Freq: Every day | ORAL | Status: DC | PRN

## 2016-05-29 MED ORDER — ALUM & MAG HYDROXIDE-SIMETH 200-200-20 MG/5ML PO SUSP: 30.0000 mL | ORAL | Status: DC | PRN

## 2016-05-29 MED ORDER — ONDANSETRON HCL 4 MG/2ML IJ SOLN: 4.0000 mg | Freq: Four times a day (QID) | INTRAMUSCULAR | Status: DC | PRN

## 2016-05-29 MED ORDER — TRANEXAMIC ACID 1000 MG/10ML IV SOLN
1000.0000 mg | INTRAVENOUS | Status: AC
  Administered 2016-05-29: 1000 mg via INTRAVENOUS
  Filled 2016-05-29: qty 1100

## 2016-05-29 MED ORDER — HYDROMORPHONE HCL 1 MG/ML IJ SOLN
1.0000 mg | INTRAMUSCULAR | Status: DC | PRN
  Administered 2016-05-29: 1 mg via INTRAVENOUS
  Filled 2016-05-29: qty 1

## 2016-05-29 MED ORDER — MIDAZOLAM HCL 5 MG/5ML IJ SOLN
INTRAMUSCULAR | Status: DC | PRN
  Administered 2016-05-29: 2 mg via INTRAVENOUS

## 2016-05-29 MED ORDER — METHOCARBAMOL 1000 MG/10ML IJ SOLN
500.0000 mg | Freq: Four times a day (QID) | INTRAVENOUS | Status: DC | PRN
  Filled 2016-05-29: qty 5

## 2016-05-29 MED ORDER — DEXAMETHASONE SODIUM PHOSPHATE 10 MG/ML IJ SOLN
INTRAMUSCULAR | Status: DC | PRN
  Administered 2016-05-29: 10 mg via INTRAVENOUS

## 2016-05-29 MED ORDER — ZOLPIDEM TARTRATE 5 MG PO TABS: 5.0000 mg | ORAL_TABLET | Freq: Every evening | ORAL | Status: DC | PRN

## 2016-05-29 MED ORDER — PROPOFOL 10 MG/ML IV BOLUS
INTRAVENOUS | Status: AC
  Filled 2016-05-29: qty 20

## 2016-05-29 MED ORDER — ONDANSETRON HCL 4 MG/2ML IJ SOLN
INTRAMUSCULAR | Status: AC
  Filled 2016-05-29: qty 2

## 2016-05-29 MED ORDER — DIPHENHYDRAMINE HCL 12.5 MG/5ML PO ELIX
12.5000 mg | ORAL_SOLUTION | ORAL | Status: DC | PRN
  Administered 2016-05-30: 12.5 mg via ORAL
  Filled 2016-05-29: qty 10

## 2016-05-29 MED ORDER — METHOCARBAMOL 500 MG PO TABS
500.0000 mg | ORAL_TABLET | Freq: Four times a day (QID) | ORAL | Status: DC | PRN
Start: 2016-05-29 — End: 2016-05-30

## 2016-05-29 MED ORDER — ASPIRIN 81 MG PO CHEW
81.0000 mg | CHEWABLE_TABLET | Freq: Two times a day (BID) | ORAL | Status: DC
  Administered 2016-05-29 – 2016-05-30 (×2): 81 mg via ORAL
  Filled 2016-05-29 (×2): qty 1

## 2016-05-29 MED ORDER — ONDANSETRON HCL 4 MG/2ML IJ SOLN
INTRAMUSCULAR | Status: DC | PRN
  Administered 2016-05-29: 4 mg via INTRAVENOUS

## 2016-05-29 MED ORDER — HYDROMORPHONE HCL 1 MG/ML IJ SOLN: 0.2500 mg | INTRAMUSCULAR | Status: DC | PRN

## 2016-05-29 MED ORDER — ONDANSETRON HCL 4 MG PO TABS: 4.0000 mg | ORAL_TABLET | Freq: Four times a day (QID) | ORAL | Status: DC | PRN

## 2016-05-29 MED ORDER — ACETAMINOPHEN 650 MG RE SUPP: 650.0000 mg | Freq: Four times a day (QID) | RECTAL | Status: DC | PRN

## 2016-05-29 MED ORDER — PROPOFOL 10 MG/ML IV BOLUS
INTRAVENOUS | Status: AC
  Filled 2016-05-29: qty 40

## 2016-05-29 MED ORDER — MENTHOL 3 MG MT LOZG: 1.0000 | LOZENGE | OROMUCOSAL | Status: DC | PRN

## 2016-05-29 MED ORDER — ACETAMINOPHEN 325 MG PO TABS: 650.0000 mg | ORAL_TABLET | Freq: Four times a day (QID) | ORAL | Status: DC | PRN

## 2016-05-29 MED ORDER — PROPOFOL 500 MG/50ML IV EMUL
INTRAVENOUS | Status: DC | PRN
  Administered 2016-05-29: 100 ug/kg/min via INTRAVENOUS

## 2016-05-29 MED ORDER — FENTANYL CITRATE (PF) 100 MCG/2ML IJ SOLN
INTRAMUSCULAR | Status: AC
  Filled 2016-05-29: qty 2

## 2016-05-29 MED ORDER — 0.9 % SODIUM CHLORIDE (POUR BTL) OPTIME
TOPICAL | Status: DC | PRN
  Administered 2016-05-29: 14:00:00
  Administered 2016-05-29: 1000 mL

## 2016-05-29 MED ORDER — PHENYLEPHRINE 40 MCG/ML (10ML) SYRINGE FOR IV PUSH (FOR BLOOD PRESSURE SUPPORT)
PREFILLED_SYRINGE | INTRAVENOUS | Status: AC
Start: 2016-05-29 — End: 2016-05-29
  Filled 2016-05-29: qty 20

## 2016-05-29 MED ORDER — FENTANYL CITRATE (PF) 100 MCG/2ML IJ SOLN
INTRAMUSCULAR | Status: DC | PRN
  Administered 2016-05-29: 100 ug via INTRAVENOUS

## 2016-05-29 MED ORDER — CEFAZOLIN SODIUM-DEXTROSE 2-4 GM/100ML-% IV SOLN
INTRAVENOUS | Status: AC
  Filled 2016-05-29: qty 100

## 2016-05-29 MED ORDER — CEFAZOLIN SODIUM-DEXTROSE 2-4 GM/100ML-% IV SOLN
2.0000 g | INTRAVENOUS | Status: AC
  Administered 2016-05-29: 2 g via INTRAVENOUS
  Filled 2016-05-29: qty 100

## 2016-05-29 MED ORDER — OXYCODONE HCL 5 MG PO TABS
5.0000 mg | ORAL_TABLET | ORAL | Status: DC | PRN
  Administered 2016-05-30: 5 mg via ORAL
  Filled 2016-05-29: qty 1

## 2016-05-29 MED ORDER — DOCUSATE SODIUM 100 MG PO CAPS
100.0000 mg | ORAL_CAPSULE | Freq: Two times a day (BID) | ORAL | Status: DC
  Administered 2016-05-29 – 2016-05-30 (×2): 100 mg via ORAL
  Filled 2016-05-29 (×2): qty 1

## 2016-05-29 MED ORDER — OXYCODONE HCL 5 MG/5ML PO SOLN
5.0000 mg | Freq: Once | ORAL | Status: DC | PRN
  Filled 2016-05-29: qty 5

## 2016-05-29 MED ORDER — CHLORHEXIDINE GLUCONATE 4 % EX LIQD: 60.0000 mL | Freq: Once | CUTANEOUS | Status: DC

## 2016-05-29 MED ORDER — PHENYLEPHRINE HCL 10 MG/ML IJ SOLN
INTRAMUSCULAR | Status: DC | PRN
  Administered 2016-05-29 (×10): 80 ug via INTRAVENOUS

## 2016-05-29 MED ORDER — KETOROLAC TROMETHAMINE 15 MG/ML IJ SOLN
7.5000 mg | Freq: Four times a day (QID) | INTRAMUSCULAR | Status: DC
  Administered 2016-05-29 – 2016-05-30 (×3): 7.5 mg via INTRAVENOUS
  Filled 2016-05-29 (×3): qty 1

## 2016-05-29 MED ORDER — ALLOPURINOL 300 MG PO TABS
450.0000 mg | ORAL_TABLET | Freq: Every day | ORAL | Status: DC
  Administered 2016-05-30: 450 mg via ORAL
  Filled 2016-05-29: qty 2

## 2016-05-29 MED ORDER — SODIUM CHLORIDE 0.9 % IV SOLN
INTRAVENOUS | Status: DC
Start: 2016-05-29 — End: 2016-05-30
  Administered 2016-05-29: 16:00:00 via INTRAVENOUS

## 2016-05-29 SURGICAL SUPPLY — 36 items
APL SKNCLS STERI-STRIP NONHPOA (GAUZE/BANDAGES/DRESSINGS) ×1
BAG SPEC THK2 15X12 ZIP CLS (MISCELLANEOUS) ×1
BAG ZIPLOCK 12X15 (MISCELLANEOUS) ×2 IMPLANT
BENZOIN TINCTURE PRP APPL 2/3 (GAUZE/BANDAGES/DRESSINGS) ×2 IMPLANT
BLADE SAW SGTL 18X1.27X75 (BLADE) ×2 IMPLANT
BLADE SAW SGTL 18X1.27X75MM (BLADE) ×1
CAPT HIP TOTAL 2 ×2 IMPLANT
CELLS DAT CNTRL 66122 CELL SVR (MISCELLANEOUS) ×1 IMPLANT
CLOSURE WOUND 1/2 X4 (GAUZE/BANDAGES/DRESSINGS) ×1
CLOTH BEACON ORANGE TIMEOUT ST (SAFETY) ×3 IMPLANT
DRAPE STERI IOBAN 125X83 (DRAPES) ×3 IMPLANT
DRAPE U-SHAPE 47X51 STRL (DRAPES) ×6 IMPLANT
DRSG AQUACEL AG ADV 3.5X10 (GAUZE/BANDAGES/DRESSINGS) ×3 IMPLANT
DURAPREP 26ML APPLICATOR (WOUND CARE) ×3 IMPLANT
ELECT REM PT RETURN 9FT ADLT (ELECTROSURGICAL) ×3
ELECTRODE REM PT RTRN 9FT ADLT (ELECTROSURGICAL) ×1 IMPLANT
GLOVE BIO SURGEON STRL SZ7.5 (GLOVE) ×3 IMPLANT
GLOVE BIOGEL PI IND STRL 8 (GLOVE) ×2 IMPLANT
GLOVE BIOGEL PI INDICATOR 8 (GLOVE) ×4
GLOVE ECLIPSE 8.0 STRL XLNG CF (GLOVE) ×3 IMPLANT
GOWN STRL REUS W/TWL XL LVL3 (GOWN DISPOSABLE) ×6 IMPLANT
HANDPIECE INTERPULSE COAX TIP (DISPOSABLE) ×3
HOLDER FOLEY CATH W/STRAP (MISCELLANEOUS) ×3 IMPLANT
PACK ANTERIOR HIP CUSTOM (KITS) ×3 IMPLANT
RETRACTOR WND ALEXIS 18 MED (MISCELLANEOUS) ×1 IMPLANT
RTRCTR WOUND ALEXIS 18CM MED (MISCELLANEOUS) ×3
SET HNDPC FAN SPRY TIP SCT (DISPOSABLE) ×1 IMPLANT
STRIP CLOSURE SKIN 1/2X4 (GAUZE/BANDAGES/DRESSINGS) ×1 IMPLANT
SUT ETHIBOND NAB CT1 #1 30IN (SUTURE) ×3 IMPLANT
SUT MNCRL AB 4-0 PS2 18 (SUTURE) ×2 IMPLANT
SUT VIC AB 0 CT1 36 (SUTURE) ×3 IMPLANT
SUT VIC AB 1 CT1 36 (SUTURE) ×3 IMPLANT
SUT VIC AB 2-0 CT1 27 (SUTURE) ×6
SUT VIC AB 2-0 CT1 TAPERPNT 27 (SUTURE) ×2 IMPLANT
TRAY FOLEY W/METER SILVER 16FR (SET/KITS/TRAYS/PACK) ×3 IMPLANT
YANKAUER SUCT BULB TIP 10FT TU (MISCELLANEOUS) ×3 IMPLANT

## 2016-05-29 NOTE — Transfer of Care (Signed)
Immediate Anesthesia Transfer of Care Note  Patient: Anthony English  Procedure(s) Performed: Procedure(s): LEFT TOTAL HIP ARTHROPLASTY ANTERIOR APPROACH (Left)  Patient Location: PACU  Anesthesia Type:spinal  Level of Consciousness: sedated  Airway & Oxygen Therapy: Patient Spontanous Breathing and Patient connected to face mask oxygen  Post-op Assessment: Report given to RN and Post -op Vital signs reviewed and stable  Post vital signs: Reviewed and stable  Last Vitals:  Vitals:   05/29/16 0752  BP: 119/84  Pulse: (!) 59  Resp: 18  Temp: 36.6 C    Last Pain:  Vitals:   05/29/16 0808  TempSrc:   PainSc: 0-No pain      Patients Stated Pain Goal: 4 ([id number])  Complications: No apparent anesthesia complications

## 2016-05-29 NOTE — H&P (Signed)
TOTAL HIP ADMISSION H&P  Patient is admitted for left total hip arthroplasty.  Subjective:  Chief Complaint: left hip pain  HPI: Anthony English, 65 y.o. male, has a history of pain and functional disability in the left hip(s) due to arthritis and patient has failed non-surgical conservative treatments for greater than 12 weeks to include NSAID's and/or analgesics, corticosteriod injections, flexibility and strengthening excercises, weight reduction as appropriate and activity modification.  Onset of symptoms was gradual starting 2 years ago with gradually worsening course since that time.The patient noted no past surgery on the left hip(s).  Patient currently rates pain in the left hip at 9 out of 10 with activity. Patient has night pain, worsening of pain with activity and weight bearing, pain that interfers with activities of daily living and pain with passive range of motion. Patient has evidence of subchondral sclerosis, periarticular osteophytes and joint space narrowing by imaging studies. This condition presents safety issues increasing the risk of falls.  There is no current active infection.  Patient Active Problem List   Diagnosis Date Noted  . Unilateral primary osteoarthritis, left hip 05/29/2016   Past Medical History:  Diagnosis Date  . Arthritis   . Gout   . Hypercholesteremia   . Hypertension     Past Surgical History:  Procedure Laterality Date  . COLONOSCOPY    . COLONOSCOPY    . COLONOSCOPY N/A 04/02/2016   Procedure: COLONOSCOPY;  Surgeon: Rogene Houston, MD;  Location: [facility] SUITE;  Service: Endoscopy;  Laterality: N/A;  1230  . VASECTOMY      No prescriptions prior to admission.   No Known Allergies  Social History  Substance Use Topics  . Smoking status: Never Smoker  . Smokeless tobacco: Former Systems developer    Quit date: 04/02/1990  . Alcohol use Yes     Comment: occasional    Family History  Problem Relation Age of Onset  . Colon cancer Neg Hx      Review  of Systems  Musculoskeletal: Positive for joint pain.  All other systems reviewed and are negative.   Objective:  Physical Exam  Constitutional: He is oriented to person, place, and time. He appears well-developed and well-nourished.  HENT:  Head: Normocephalic and atraumatic.  Eyes: EOM are normal. Pupils are equal, round, and reactive to light.  Neck: Normal range of motion. Neck supple.  Cardiovascular: Normal rate and regular rhythm.   Respiratory: Effort normal and breath sounds normal.  GI: Soft. Bowel sounds are normal.  Musculoskeletal:       Left hip: He exhibits decreased range of motion, decreased strength, tenderness and bony tenderness.  Neurological: He is alert and oriented to person, place, and time.  Skin: Skin is warm.  Psychiatric: He has a normal mood and affect.    Vital signs in last 24 hours:    Labs:   Estimated body mass index is 34.45 kg/m as calculated from the following:   Height as of 05/19/16: 6' (1.829 m).   Weight as of 05/19/16: 254 lb (115.2 kg).   Imaging Review Plain radiographs demonstrate severe degenerative joint disease of the left hip(s). The bone quality appears to be excellent for age and reported activity level.  Assessment/Plan:  End stage arthritis, left hip(s)  The patient history, physical examination, clinical judgement of the provider and imaging studies are consistent with end stage degenerative joint disease of the left hip(s) and total hip arthroplasty is deemed medically necessary. The treatment options including medical  management, injection therapy, arthroscopy and arthroplasty were discussed at length. The risks and benefits of total hip arthroplasty were presented and reviewed. The risks due to aseptic loosening, infection, stiffness, dislocation/subluxation,  thromboembolic complications and other imponderables were discussed.  The patient acknowledged the explanation, agreed to proceed with the plan and consent was  signed. Patient is being admitted for inpatient treatment for surgery, pain control, PT, OT, prophylactic antibiotics, VTE prophylaxis, progressive ambulation and ADL's and discharge planning.The patient is planning to be discharged home with home health services

## 2016-05-29 NOTE — Anesthesia Preprocedure Evaluation (Signed)
Anesthesia Evaluation  Patient identified by MRN, date of birth, ID band Patient awake    Reviewed: Allergy & Precautions, NPO status , Patient's Chart, lab work & pertinent test results  History of Anesthesia Complications Negative for: history of anesthetic complications  Airway Mallampati: II  TM Distance: >3 FB Neck ROM: Full    Dental  (+) Teeth Intact   Pulmonary neg pulmonary ROS,    breath sounds clear to auscultation       Cardiovascular hypertension, Pt. on medications (-) angina(-) Past MI and (-) CHF  Rhythm:Regular     Neuro/Psych negative neurological ROS  negative psych ROS   GI/Hepatic negative GI ROS, Neg liver ROS,   Endo/Other  Morbid obesity  Renal/GU negative Renal ROS     Musculoskeletal  (+) Arthritis ,   Abdominal   Peds  Hematology   Anesthesia Other Findings   Reproductive/Obstetrics                             Anesthesia Physical Anesthesia Plan  ASA: II  Anesthesia Plan: Spinal   Post-op Pain Management:    Induction: Intravenous  Airway Management Planned: Natural Airway, Nasal Cannula and Simple Face Mask  Additional Equipment: None  Intra-op Plan:   Post-operative Plan:   Informed Consent: I have reviewed the patients History and Physical, chart, labs and discussed the procedure including the risks, benefits and alternatives for the proposed anesthesia with the patient or authorized representative who has indicated his/her understanding and acceptance.   Dental advisory given  Plan Discussed with: CRNA and Surgeon  Anesthesia Plan Comments:         Anesthesia Quick Evaluation

## 2016-05-29 NOTE — Brief Op Note (Signed)
05/29/2016  12:22 PM  PATIENT:  Anthony English  65 y.o. male  PRE-OPERATIVE DIAGNOSIS:  Osteoarthritis left hip  POST-OPERATIVE DIAGNOSIS:  Osteoarthritis left hip  PROCEDURE:  Procedure(s): LEFT TOTAL HIP ARTHROPLASTY ANTERIOR APPROACH (Left)  SURGEON:  Surgeon(s) and Role:    * Mcarthur Rossetti, MD - Primary  PHYSICIAN ASSISTANT: Benita Stabile, PA-C  ANESTHESIA:   spinal  EBL:  Total I/O In: 1000 [I.V.:1000] Out: 500 [Urine:300; Blood:200]  COUNTS:  YES  DICTATION: .Other Dictation: Dictation Number [id number]  PLAN OF CARE: Admit to inpatient   PATIENT DISPOSITION:  PACU - hemodynamically stable.   Delay start of Pharmacological VTE agent (>24hrs) due to surgical blood loss or risk of bleeding: no

## 2016-05-29 NOTE — Anesthesia Procedure Notes (Signed)
Spinal  Patient location during procedure: OR Start time: 05/29/2016 11:00 AM End time: 05/29/2016 11:02 AM Staffing Resident/CRNA: ALDAY, STEPHEN R Performed: resident/CRNA  Preanesthetic Checklist Completed: patient identified, site marked, surgical consent, pre-op evaluation, timeout performed, IV checked, risks and benefits discussed and monitors and equipment checked Spinal Block Patient position: sitting Prep: Betadine Patient monitoring: heart rate, cardiac monitor, continuous pulse ox and blood pressure Approach: midline Location: L3-4 Needle Needle type: Pencan  Needle gauge: 24 G Needle length: 10 cm Needle insertion depth: 7 cm Assessment Sensory level: T6 Additional Notes Timeout performed. SAB kit date checked. SAB without difficulty     

## 2016-05-29 NOTE — Anesthesia Postprocedure Evaluation (Signed)
Anesthesia Post Note  Patient: Anthony English  Procedure(s) Performed: Procedure(s) (LRB): LEFT TOTAL HIP ARTHROPLASTY ANTERIOR APPROACH (Left)  Patient location during evaluation: PACU Anesthesia Type: Spinal Level of consciousness: awake Pain management: pain level controlled Vital Signs Assessment: post-procedure vital signs reviewed and stable Respiratory status: spontaneous breathing Cardiovascular status: stable Postop Assessment: spinal receding Anesthetic complications: no    Last Vitals:  Vitals:   05/29/16 1626 05/29/16 1733  BP: 110/76 108/73  Pulse: 64 70  Resp: 15 16  Temp: 36.3 C 36.4 C    Last Pain:  Vitals:   05/29/16 1733  TempSrc: Axillary  PainSc:                  [name]

## 2016-05-30 LAB — BASIC METABOLIC PANEL
ANION GAP: 7 (ref 5–15)
BUN: 17 mg/dL (ref 6–20)
CALCIUM: 8.7 mg/dL — AB (ref 8.9–10.3)
CO2: 24 mmol/L (ref 22–32)
CREATININE: 0.89 mg/dL (ref 0.61–1.24)
Chloride: 105 mmol/L (ref 101–111)
GFR calc non Af Amer: >60 mL/min (ref >60)
Glucose, Bld: 154 mg/dL — ABNORMAL HIGH (ref 65–99)
Potassium: 4.4 mmol/L (ref 3.5–5.1)
SODIUM: 136 mmol/L (ref 135–145)

## 2016-05-30 LAB — CBC
HCT: 41.6 % (ref 39.0–52.0)
HEMOGLOBIN: 14.1 g/dL (ref 13.0–17.0)
MCH: 30.6 pg (ref 26.0–34.0)
MCHC: 33.9 g/dL (ref 30.0–36.0)
MCV: 90.2 fL (ref 78.0–100.0)
Platelets: 194 10*3/uL (ref 150–400)
RBC: 4.61 MIL/uL (ref 4.22–5.81)
RDW: 13.2 % (ref 11.5–15.5)
WBC: 14.7 10*3/uL — AB (ref 4.0–10.5)

## 2016-05-30 MED ORDER — OXYCODONE-ACETAMINOPHEN 5-325 MG PO TABS: 1.0000 | ORAL_TABLET | ORAL | 0 refills | Status: DC | PRN

## 2016-05-30 MED ORDER — METHOCARBAMOL 500 MG PO TABS: 500.0000 mg | ORAL_TABLET | Freq: Four times a day (QID) | ORAL | 1 refills | Status: DC | PRN

## 2016-05-30 MED ORDER — ASPIRIN 81 MG PO CHEW: 81.0000 mg | CHEWABLE_TABLET | Freq: Two times a day (BID) | ORAL | 0 refills | Status: DC

## 2016-05-30 NOTE — Care Management Note (Addendum)
Case Management Note  Patient Details  Name: Anthony English MRN: [id number] Date of Birth: February 19, 1951  Subjective/Objective:   Left THR                 Action/Plan: Discharge Planning: AVS reviewed: NCM spoke to pt's wife, [name]. Pt has RW and 3n1 bedside commode that he will borrow from his brother. Offered choice for HH/list provided. Explained office prearranged with [facility]. Wife states she prefers [facility], she has family member that is a Community education officer with [place].  Contacted [facility] [facility] to cancel referral. Contacted [facility] to arrange [facility] PT.   PCP Asencion Noble MD   Expected Discharge Date:                  Expected Discharge Plan:  [place]  In-House Referral:  NA  Discharge planning Services  CM Consult  Post Acute Care Choice:  Home Health Choice offered to:  Patient  DME Arranged:  N/A DME Agency:  NA  HH Arranged:  PT [place] Agency:  [facility] (formerly [employer])  Status of Service:  Completed, signed off  If discussed at [employer], dates discussed:    Additional Comments:  Erenest Rasher, RN 05/30/2016, 9:55 AM

## 2016-05-30 NOTE — Discharge Instructions (Signed)

## 2016-05-30 NOTE — Progress Notes (Signed)
Subjective: 1 Day Post-Op Procedure(s) (LRB): LEFT TOTAL HIP ARTHROPLASTY ANTERIOR APPROACH (Left) Patient reports pain as mild to moderate. Has been out in hallway walking with PT. .    Objective: Vital signs in last 24 hours: Temp:  [97.4 F (36.3 C)-98.5 F (36.9 C)] 98.1 F (36.7 C) (12/02 0531) Pulse Rate:  [50-85] 66 (12/02 0531) Resp:  [12-18] 18 (12/02 0531) BP: (90-110)/(53-83) 106/74 (12/02 0531) SpO2:  [95 %-100 %] 99 % (12/02 0531)  Intake/Output from previous day: 12/01 0701 - 12/02 0700 In: 4635 [P.O.:1260; I.V.:3275; IV Piggyback:100] Out: 3400 [Urine:3200; Blood:200] Intake/Output this shift: Total I/O In: 240 [P.O.:240] Out: 300 [Urine:300]   Recent Labs  05/30/16 0436  HGB 14.1    Recent Labs  05/30/16 0436  WBC 14.7*  RBC 4.61  HCT 41.6  PLT 194    Recent Labs  05/30/16 0436  NA 136  K 4.4  CL 105  CO2 24  BUN 17  CREATININE 0.89  GLUCOSE 154*  CALCIUM 8.7*   No results for input(s): LABPT, INR in the last 72 hours.  Sensation intact distally Intact pulses distally Dorsiflexion/Plantar flexion intact Incision: dressing C/D/I Compartment soft  Assessment/Plan: 1 Day Post-Op Procedure(s) (LRB): LEFT TOTAL HIP ARTHROPLASTY ANTERIOR APPROACH (Left) Up with therapy  If does well with Pt discharge home later today.   Tyrel Lex 05/30/2016, 8:48 AM

## 2016-05-30 NOTE — Discharge Summary (Signed)
Patient ID: Anthony English MRN: [id number] DOB/AGE: 10-17-1950 65 y.o.  Admit date: 05/29/2016 Discharge date: 05/30/2016  Admission Diagnoses:  Principal Problem:   Unilateral primary osteoarthritis, left hip Active Problems:   Status post left hip replacement   Discharge Diagnoses:  Same  Past Medical History:  Diagnosis Date  . Arthritis   . Gout   . Hypercholesteremia   . Hypertension     Surgeries: Procedure(s): LEFT TOTAL HIP ARTHROPLASTY ANTERIOR APPROACH on 05/29/2016   Consultants:  PT  Discharged Condition: Improved  Hospital Course: Anthony English is an 65 y.o. male who was admitted 05/29/2016 for operative treatment ofUnilateral primary osteoarthritis, left hip. Patient has severe unremitting pain that affects sleep, daily activities, and work/hobbies. After pre-op clearance the patient was taken to the operating room on 05/29/2016 and underwent  Procedure(s): LEFT TOTAL HIP ARTHROPLASTY ANTERIOR APPROACH.    Patient was given perioperative antibiotics: Anti-infectives    Start     Dose/Rate Route Frequency Ordered Stop   05/29/16 1800  ceFAZolin (ANCEF) IVPB 1 g/50 mL premix     1 g 100 mL/hr over 30 Minutes Intravenous Every 6 hours 05/29/16 1631 05/30/16 0021   05/29/16 0908  ceFAZolin (ANCEF) IVPB 2g/100 mL premix     2 g 200 mL/hr over 30 Minutes Intravenous On call to O.R. 05/29/16 0908 05/29/16 1103       Patient was given sequential compression devices, early ambulation, and chemoprophylaxis to prevent DVT.  Patient benefited maximally from hospital stay and there were no complications.    Recent vital signs: Patient Vitals for the past 24 hrs:  BP Temp Temp src Pulse Resp SpO2  05/30/16 0531 106/74 98.1 F (36.7 C) Oral 66 18 99 %  05/30/16 0133 94/68 97.7 F (36.5 C) Oral 65 18 96 %  05/29/16 2249 98/66 98.5 F (36.9 C) Oral 85 18 95 %  05/29/16 1945 (!) 95/56 98 F (36.7 C) Oral 82 18 95 %  05/29/16 1830 96/67 97.5 F (36.4 C) Axillary 81 16 98  %  05/29/16 1733 108/73 97.5 F (36.4 C) Axillary 70 16 100 %  05/29/16 1626 110/76 97.4 F (36.3 C) Oral 64 15 100 %  05/29/16 1545 95/63 98 F (36.7 C) - (!) 51 12 96 %  05/29/16 1530 98/71 - - (!) 53 16 97 %  05/29/16 1515 99/72 - - (!) 50 13 99 %  05/29/16 1500 105/77 - - (!) 55 16 99 %  05/29/16 1445 (!) 101/56 - - (!) 54 13 97 %  05/29/16 1430 101/74 - - (!) 55 14 97 %  05/29/16 1415 97/69 - - (!) 50 13 98 %  05/29/16 1400 (!) 90/53 - - (!) 51 14 96 %  05/29/16 1345 102/81 - - (!) 57 14 99 %  05/29/16 1330 106/83 - - (!) 56 16 97 %  05/29/16 1315 102/78 - - (!) 59 13 99 %  05/29/16 1300 104/76 - - (!) 58 14 100 %  05/29/16 1249 - 97.5 F (36.4 C) - - - 100 %     Recent laboratory studies:  Recent Labs  05/30/16 0436  WBC 14.7*  HGB 14.1  HCT 41.6  PLT 194  NA 136  K 4.4  CL 105  CO2 24  BUN 17  CREATININE 0.89  GLUCOSE 154*  CALCIUM 8.7*     Discharge Medications:     Medication List    TAKE these medications   allopurinol 300 MG  tablet Commonly known as:  ZYLOPRIM Take 450 mg by mouth daily.   aspirin 81 MG chewable tablet Chew 1 tablet (81 mg total) by mouth 2 (two) times daily.   diphenhydramine-acetaminophen 25-500 MG Tabs tablet Commonly known as:  TYLENOL PM Take 1 tablet by mouth at bedtime as needed (sleep).   lisinopril 20 MG tablet Commonly known as:  PRINIVIL,ZESTRIL Take 10 mg by mouth daily.   methocarbamol 500 MG tablet Commonly known as:  ROBAXIN Take 1 tablet (500 mg total) by mouth every 6 (six) hours as needed for muscle spasms.   oxyCODONE-acetaminophen 5-325 MG tablet Commonly known as:  ROXICET Take 1-2 tablets by mouth every 4 (four) hours as needed.   pravastatin 40 MG tablet Commonly known as:  PRAVACHOL Take 40 mg by mouth daily.   sodium chloride 0.65 % Soln nasal spray Commonly known as:  OCEAN Place 1 spray into both nostrils 3 (three) times daily as needed for congestion.            Durable Medical  Equipment        Start     Ordered   05/29/16 1632  DME Walker rolling  Once    Question:  Patient needs a walker to treat with the following condition  Answer:  Status post left hip replacement   05/29/16 1631   05/29/16 1632  DME 3 n 1  Once     05/29/16 1631      Diagnostic Studies: Dg Hip Port Unilat With Pelvis 1v Left  Result Date: 05/29/2016 CLINICAL DATA:  Status post total hip arthroplasty. EXAM: DG HIP (WITH OR WITHOUT PELVIS) 1V PORT LEFT COMPARISON:  Radiographs 02/18/2015. Intraoperative radiographs earlier today. FINDINGS: Patient is status post LEFT anterior THR. Satisfactory position and alignment of the acetabular and femoral components. IMPRESSION: Satisfactory postoperative appearance. Electronically Signed   By: Staci Righter M.D.   On: 05/29/2016 13:15   Dg Hip Operative Unilat With Pelvis Left  Result Date: 05/29/2016 CLINICAL DATA:  Intraoperative view during a left hip replacement for osteoarthritis. FLUOROSCOPY TIME:  27 seconds. EXAM: OPERATIVE LEFT HIP (WITH PELVIS IF PERFORMED) 2 VIEWS TECHNIQUE: Fluoroscopic spot image(s) were submitted for interpretation post-operatively. COMPARISON:  February 18, 2015 FINDINGS: The patient is status post left hip replacement. The acetabular and femoral components are in good position. IMPRESSION: Two views obtained during left hip replacement as above. Electronically Signed   By: Dorise Bullion III M.D   On: 05/29/2016 12:34    Disposition: 01-Home or [place], [place]. Schedule an appointment as soon as possible for a visit in 2 week(s).   Specialty:  Orthopedic Surgery Contact information: [place] [address] [phone]        Mcarthur Rossetti, MD .   Specialty:  Orthopedic Surgery Contact information: [place] [address] [phone]            Signed: Erskine Emery 05/30/2016, 8:59 AM  Discharge

## 2016-05-30 NOTE — Evaluation (Signed)
Physical Therapy Evaluation Patient Details Name: Anthony English MRN: [id number] DOB: [birth year] Today's Date: 05/30/2016   History of Present Illness  s/p L DA THA  Clinical Impression  Pt is s/p THA resulting in the deficits listed below (see PT Problem List). * Pt will benefit from skilled PT to increase their independence and safety with mobility to allow discharge to the venue listed below.  Pt doing very well; all questions and concerns addressed, pt and wife feel ready to D/C     Follow Up Recommendations Home health PT    Equipment Recommendations  None recommended by PT    Recommendations for Other Services       Precautions / Restrictions Precautions Precautions: Fall Restrictions Weight Bearing Restrictions: No Other Position/Activity Restrictions: WBAT      Mobility  Bed Mobility               General bed mobility comments: NT--in chair  Transfers Overall transfer level: Needs assistance Equipment used: Rolling walker (2 wheeled) Transfers: Sit to/from Stand Sit to Stand: Supervision         General transfer comment: cues for hand placement  Ambulation/Gait Ambulation/Gait assistance: Min guard Ambulation Distance (Feet): 120 Feet Assistive device: Rolling walker (2 wheeled) Gait Pattern/deviations: Step-through pattern;Decreased stride length     General Gait Details: cues for gait progression  Stairs Stairs: Yes Stairs assistance: Min guard Stair Management: No rails;Step to pattern;Backwards;With walker Number of Stairs: 1 General stair comments: cues for sequence  Wheelchair Mobility    Modified Rankin (Stroke Patients Only)       Balance Overall balance assessment: Needs assistance           Standing balance-Leahy Scale: Fair                               Pertinent Vitals/Pain Pain Assessment: 0-10 Pain Score: 1  Pain Location: L hip Pain Descriptors / Indicators: Discomfort    Home Living  Family/patient expects to be discharged to:: Private residence Living Arrangements: Spouse/significant other Available Help at Discharge: Family Type of Home: House Home Access: Stairs to enter   Technical brewer of Steps: 1 Home Layout: One level Home Equipment: Bedside commode;Walker - 2 wheels      Prior Function Level of Independence: Independent               Hand Dominance        Extremity/Trunk Assessment   Upper Extremity Assessment: Defer to OT evaluation           Lower Extremity Assessment: LLE deficits/detail   LLE Deficits / Details: AAROM hip WFL; strength 3+/5 to 4/5 knee and hip; ankle WFL     Communication   Communication: No difficulties  Cognition Arousal/Alertness: Awake/alert Behavior During Therapy: WFL for tasks assessed/performed Overall Cognitive Status: Within Functional Limits for tasks assessed                      General Comments      Exercises     Assessment/Plan    PT Assessment Patient needs continued PT services  PT Problem List Decreased strength;Decreased range of motion;Decreased activity tolerance;Decreased mobility;Decreased knowledge of use of DME;Decreased knowledge of precautions          PT Treatment Interventions DME instruction;Gait training;Therapeutic activities;Functional mobility training;Therapeutic exercise;Patient/family education    PT Goals (Current goals can be found in the Care Plan section)  Acute Rehab PT Goals Patient Stated Goal: home soon PT Goal Formulation: All assessment and education complete, DC therapy Potential to Achieve Goals: Good    Frequency 7X/week   Barriers to discharge        Co-evaluation               End of Session Equipment Utilized During Treatment: Gait belt Activity Tolerance: Patient tolerated treatment well Patient left: with call bell/phone within reach;in chair;with chair alarm set           Time: [id number] PT Time  Calculation (min) (ACUTE ONLY): 32 min   Charges:   PT Evaluation $PT Eval Low Complexity: 1 Procedure PT Treatments $Gait Training: 8-22 mins   PT G Codes:        Tauheed Mcfayden 06-09-2016, 11:42 AM

## 2016-05-30 NOTE — Progress Notes (Signed)
Discharge instructions reviewed with patient and wife utilizing teach back method. No questions at this time patient discharged to home.

## 2016-05-30 NOTE — Evaluation (Signed)
Occupational Therapy One Time Evaluation Patient Details Name: Anthony English MRN: [id number] DOB: May 15, 1951 Today's Date: 05/30/2016    History of Present Illness s/p L DA THA   Clinical Impression   Pt doing well and hopeful for d/c today. All education completed with pt and wife and they verbalize understanding of all.     Follow Up Recommendations  No OT follow up;Supervision/Assistance - 24 hour    Equipment Recommendations  None recommended by OT    Recommendations for Other Services       Precautions / Restrictions Precautions Precautions: Fall Restrictions Weight Bearing Restrictions: No Other Position/Activity Restrictions: WBAT      Mobility Bed Mobility Overal bed mobility: Needs Assistance Bed Mobility: Supine to Sit     Supine to sit: Min assist     General bed mobility comments: for L LE over to EOB.  Transfers Overall transfer level: Needs assistance Equipment used: Rolling walker (2 wheeled) Transfers: Sit to/from Stand Sit to Stand: Min guard         General transfer comment: cues for hand placement and LE management.    Balance Overall balance assessment: Needs assistance           Standing balance-Leahy Scale: Fair                              ADL Overall ADL's : Needs assistance/impaired Eating/Feeding: Independent;Sitting   Grooming: Set up;Sitting   Upper Body Bathing: Set up;Sitting   Lower Body Bathing: Minimal assistance;Sit to/from stand   Upper Body Dressing : Set up;Sitting   Lower Body Dressing: Moderate assistance;Sit to/from stand   Toilet Transfer: Min guard;Ambulation;RW;BSC   Toileting- Water quality scientist and Hygiene: Min guard;Sit to/from stand   Tub/ Shower Transfer: Walk-in shower;Minimal assistance;Rolling walker     General ADL Comments: Educated on techniques for shower transfer using the walker to step over the shower ledge backwards. Discussed leaving walker on outside of shower  and having 3in1 face out of the shower and stepping in and sitting down on 3in1 versus stepping in shower and bringing walker inside of shower to back around to 3in1 facing shower head. Wife plans to assist with LB dressing. Discussed safety with side stepping in tight spaces with walker.      Vision     Perception     Praxis      Pertinent Vitals/Pain Pain Assessment: 0-10 Pain Score: 1  Pain Location: L hip Pain Descriptors / Indicators: Discomfort Pain Intervention(s): Monitored during session;Ice applied     Hand Dominance     Extremity/Trunk Assessment Upper Extremity Assessment Upper Extremity Assessment: Overall WFL for tasks assessed          Communication Communication Communication: No difficulties   Cognition Arousal/Alertness: Awake/alert Behavior During Therapy: WFL for tasks assessed/performed Overall Cognitive Status: Within Functional Limits for tasks assessed                     General Comments       Exercises       Shoulder Instructions      Home Living Family/patient expects to be discharged to:: Private residence Living Arrangements: Spouse/significant other Available Help at Discharge: Family Type of Home: House Home Access: Stairs to enter Technical brewer of Steps: 1   Home Layout: One level     Bathroom Shower/Tub: Occupational psychologist: Standard     Home Equipment:  Bedside commode;Walker - 2 wheels          Prior Functioning/Environment Level of Independence: Independent                 OT Problem List:     OT Treatment/Interventions:      OT Goals(Current goals can be found in the care plan section) Acute Rehab OT Goals Patient Stated Goal: home today OT Goal Formulation: With patient  OT Frequency:     Barriers to D/C:            Co-evaluation              End of Session Equipment Utilized During Treatment: Rolling walker  Activity Tolerance: Patient tolerated  treatment well Patient left: in chair;with call bell/phone within reach   Time: [id number] OT Time Calculation (min): 33 min Charges:  OT General Charges $OT Visit: 1 Procedure OT Treatments $Therapeutic Activity: 8-22 mins G-Codes:    Jules Schick 05/30/2016, 1:29 PM

## 2016-06-01 NOTE — Op Note (Signed)
[name], [name]:  [id number]  MEDICAL RECORD NO.:  [id number]  LOCATION:                                 FACILITY:  PHYSICIAN:  [name]. [name], M.D.DATE OF BIRTH:  May 22, 1951  DATE OF PROCEDURE:  05/29/2016 DATE OF DISCHARGE:                              OPERATIVE REPORT   PREOPERATIVE DIAGNOSIS:  Primary osteoarthritis and degenerative joint disease, left hip.  POSTOPERATIVE DIAGNOSIS:  Primary osteoarthritis and degenerative joint disease, left hip.  PROCEDURE:  Left total hip arthroplasty through direct anterior approach.  IMPLANTS:  DePuy Sector Gription acetabular component size 56, size 36+ 4 polyethylene liner, size 14 Corail femoral component with standard offset, size 36+ 5 ceramic hip ball.  SURGEON:  [name]. [name], M.D.  ASSISTANT:  Erskine Emery, PA-C.  ANESTHESIA:  Spinal.  BLOOD LOSS:  Less than 500 mL.  ANTIBIOTICS:  2 g of IV Ancef.  COMPLICATIONS:  None.  INDICATIONS:  Mr. Anthony English is a very pleasant 65 year old gentleman, with a known primary osteoarthritis affecting his left hip.  His pain is daily and it is detrimentally affected his activities of daily living, his quality of life, and his mobility.  At this point, he has tried and failed all forms of conservative treatment and wished to proceed with a total hip arthroplasty.  He understands the risk of acute blood loss anemia, nerve and vessel injury, fracture, infection, dislocation, and DVT.  He understands our goals are decreased pain, improved mobility, and overall improved quality of life.  PROCEDURE DESCRIPTION:  After informed consent was obtained, appropriate left hip was marked.  He was brought to the operating room, and spinal anesthesia was obtained while he was on a stretcher.  He was then laid in a supine position on the stretcher.  Foley catheter was placed and both feet had traction boots applied to them.  Next, he was placed supine  on the Hana fracture table with the perineal post in place and both legs in inline skeletal traction devices, but no traction applied. His left operative hip was prepped and draped with DuraPrep and sterile drapes.  Time-out was called, and he was identified as correct patient and correct left hip.  We then made incision just inferior and posterior to the anterior superior iliac spine and carried this obliquely down the anterior thigh.  We dissected down the tensor fascia lata muscle and tensor fascia was then divided longitudinally to proceed with a direct anterior approach to the hip.  We identified and cauterized the circumflex vessels and identified the hip capsule.  I opened up the hip capsule in an L-type format finding a moderate joint effusion and has significant periarticular osteophytes.  We placed Cobra retractors within the joint capsule from the medial and lateral femoral neck and then made our femoral neck cut, but it was just proximal to the lesser trochanter with an oscillating saw and completed this on osteotome.  I placed a corkscrew guide in the femoral head and removed the femoral head in its entirety and found a large area completely devoid of cartilage.  We then cleaned the acetabulum and remnants of the acetabular labrum  and other debris.  I placed a bent Hohmann over the medial acetabular rim and then began reaming under direct visualization from a size 43 reamer all the way up to a size 56 with all reamers under direct visualization, the last 2 reamers were placed under direct fluoroscopy, so we could obtain our depth of reaming, our inclination and anteversion.  Once I was pleased with this, we placed the real DePuy Sector Gription acetabular component size 58 and again this was also placed under visualization and direct fluoroscopy.  Once we were pleased with position of this, we placed a 36+ 4 polyethylene liner for that size acetabular component.  Attention was  then turned to the femur. With the leg externally rotated to 120 degrees, extended and adducted, we were able to release the lateral joint capsule.  I placed a Mueller retractor medially and a Hohmann retractor behind the greater trochanter.  I used a box cutting osteotome to enter femoral canal and a rongeur to lateralize.  We then began broaching from a size 8 broach using the Corail broaching system all the way up to a size 14.  With a 14, we trialed a standard offset femoral neck and a 36+ 1.5 hip ball. We rolled leg back over and up with traction and internal rotation reducing the pelvis and it was stable, but we felt like his offset and leg length just to be a little bit more.  We dislocated the hip and removed the trial components.  We were able to place the real Corail femoral component size 14 with standard offset and the real 36+ 5 ceramic hip ball reduced this in the acetabulum.  We were pleased with the range of motion stability, offset, and leg length.  We then were able to irrigate the soft tissue with normal saline solution using pulsatile lavage.  We closed the joint capsule with interrupted #1 Ethibond suture followed by running #1 Vicryl in the tensor fascia, 0 Vicryl in the deep tissue, 2-0 Vicryl in the subcutaneous tissue, 4-0 Monocryl, subcuticular stitch, and Steri-Strips on the skin.  An Aquacel dressing was applied.  He was taken off the Hana table, taken to recovery room in stable condition.  All final counts were correct. There were no complications noted.  Of note, Erskine Emery, PA-C assisted in the entire case.  His assistance was crucial for facilitating all aspects of his case.     [name]. [name], M.D.   ______________________________ [name]. [name], M.D.    CYB/MEDQ  D:  05/29/2016  T:  05/30/2016  Job:  [id number]

## 2016-06-11 ENCOUNTER — Ambulatory Visit (INDEPENDENT_AMBULATORY_CARE_PROVIDER_SITE_OTHER): Payer: Medicare Other | Admitting: Orthopaedic Surgery

## 2016-06-11 DIAGNOSIS — Z96642 Presence of left artificial hip joint: Secondary | ICD-10-CM

## 2016-06-11 NOTE — Progress Notes (Signed)
The patient is doing great 2 weeks status post a left total hip arthroplasty through direct anterior approach. On examination exam it with a cane. I removed his old Steri-Strips and placed knee Steri-Strips. There is a mild to moderate seroma but he did not want this drained. There is no evidence of infection otherwise. His leg lengths are equal.  We interlocked questions for him today. He can drive at this point. He'll stop his aspirin as well. We'll see him back in a month to see how is doing overall. No x-rays will be needed.

## 2016-06-12 ENCOUNTER — Telehealth (INDEPENDENT_AMBULATORY_CARE_PROVIDER_SITE_OTHER): Payer: Self-pay | Admitting: Orthopaedic Surgery

## 2016-06-12 NOTE — Telephone Encounter (Signed)
Pt wife Juliene Pina is calling to get advice on pt thigh. She states it is really puffy and she wants to know what to do. She doesn't want to set another appt just needs advice on what to do. Her number is [phone]

## 2016-06-15 ENCOUNTER — Encounter (INDEPENDENT_AMBULATORY_CARE_PROVIDER_SITE_OTHER): Payer: Self-pay

## 2016-06-15 ENCOUNTER — Ambulatory Visit (INDEPENDENT_AMBULATORY_CARE_PROVIDER_SITE_OTHER): Payer: Medicare Other | Admitting: Orthopaedic Surgery

## 2016-06-15 DIAGNOSIS — Z96642 Presence of left artificial hip joint: Secondary | ICD-10-CM

## 2016-06-15 NOTE — Telephone Encounter (Signed)
Please advise. Want me to work him in for possible seroma?

## 2016-06-15 NOTE — Addendum Note (Signed)
Addended by: Jacklyn Shell on: 06/15/2016 03:26 PM   Modules accepted: Orders

## 2016-06-15 NOTE — Telephone Encounter (Signed)
Please work him in today or tomorrow

## 2016-06-15 NOTE — Progress Notes (Signed)
A she comes in today to have a postop seroma drained from his left hip. Offered in this last week but he did not want to have it drained now he does. He's had no discomfort with it at all.  On examination of his left hip is Steri-Strips are in place there is no redness. There is a mild seroma. I cleaned is here with Betadine and alcohol and was able to aspirate about 40 mL of serous Amos fluid from the hip this gave him great relief. He will keep his normal follow-up with [place] in the next 3-4 weeks. He'll increase his activities as comfort allows. He can try heating pad on this area twice daily as well.

## 2016-06-15 NOTE — Telephone Encounter (Signed)
Can you work him in tomorrow morning at 9:15

## 2016-07-13 ENCOUNTER — Ambulatory Visit (INDEPENDENT_AMBULATORY_CARE_PROVIDER_SITE_OTHER): Payer: Medicare Other | Admitting: Orthopaedic Surgery

## 2016-07-13 DIAGNOSIS — Z96642 Presence of left artificial hip joint: Secondary | ICD-10-CM

## 2016-07-13 NOTE — Progress Notes (Signed)
The patient is now 6 weeks status post a left total hip arthroplasty through direct injury approach. He is doing well. He still has some stiffness but overall he says is better than what he was preop. He does report some knee pain and that is resolving. He has some stiffness still with putting his shoes and socks on.  On examination his had good range of motion of his left hip and his left knee. He has no numbness and tingling. We did not obtain any x-rays today.  At this point he'll increase his activities as he tolerates. I don't really see him back for 6 months. At that visit I would just like a low AP pelvis.

## 2016-07-20 ENCOUNTER — Ambulatory Visit (INDEPENDENT_AMBULATORY_CARE_PROVIDER_SITE_OTHER): Payer: Medicare Other | Admitting: Orthopaedic Surgery

## 2016-09-12 IMAGING — CT CT ABD-PEL WO/W CM
2 of 8 series · 12 of 46 positions shown, 18 images · IV contrast (Omnipaque 300)
Comparison: None.

CLINICAL DATA: Gross hematuria.

EXAM:
CT ABDOMEN AND PELVIS WITHOUT AND WITH CONTRAST
TECHNIQUE: Multidetector CT imaging of the abdomen and pelvis was performed
following the standard protocol before and following the bolus
administration of intravenous contrast.
CONTRAST:  150mL OMNIPAQUE IOHEXOL 300 MG/ML  SOLN

[Series 2: hematuria post contrast (id) · axial · 0.91mm/px · z∈[-466,-36]mm · 9 of 106 slices shown, 15 images]
[im 10/106  soft-tissue]
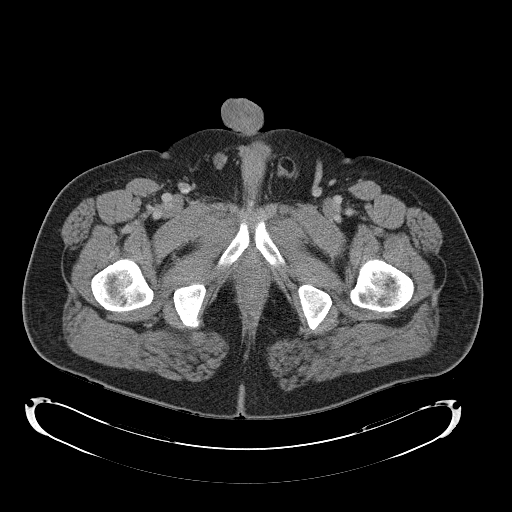
[im 10/106  bone]
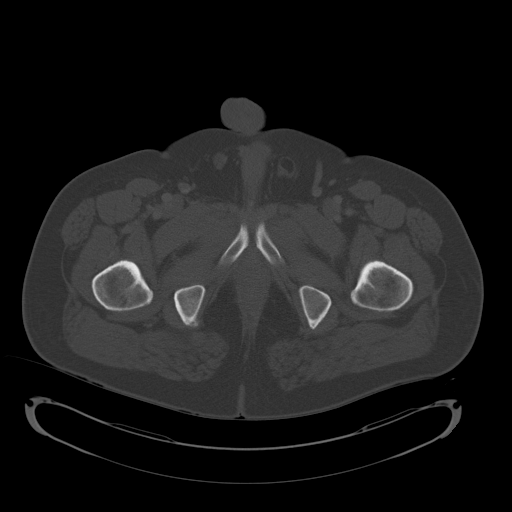
[im 20/106  soft-tissue]
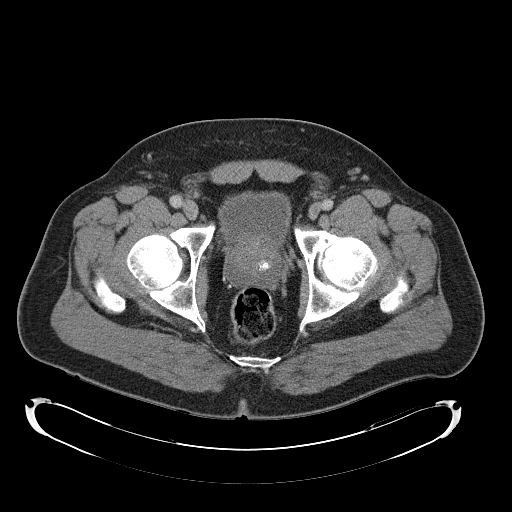
[im 29/106  soft-tissue]
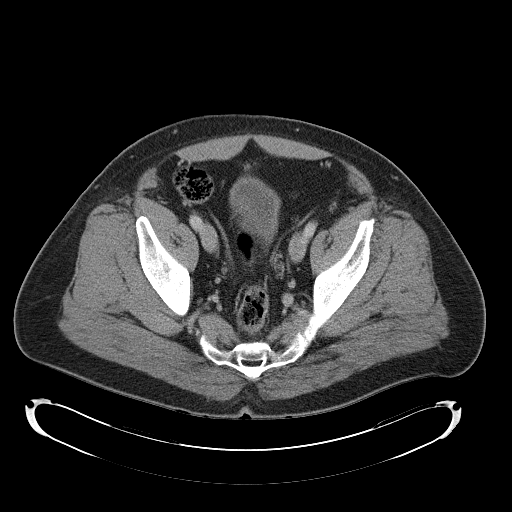
[im 39/106  soft-tissue]
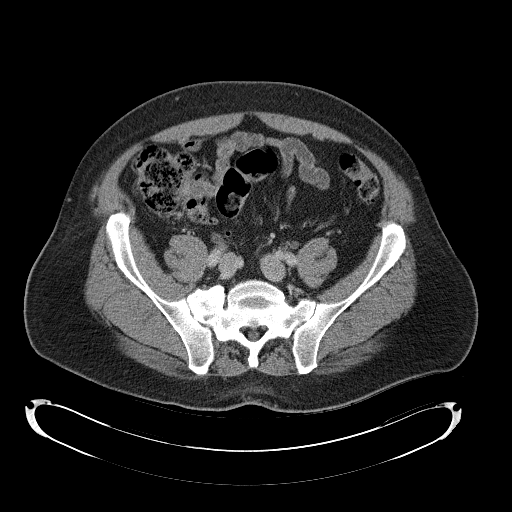
[im 58/106  soft-tissue]
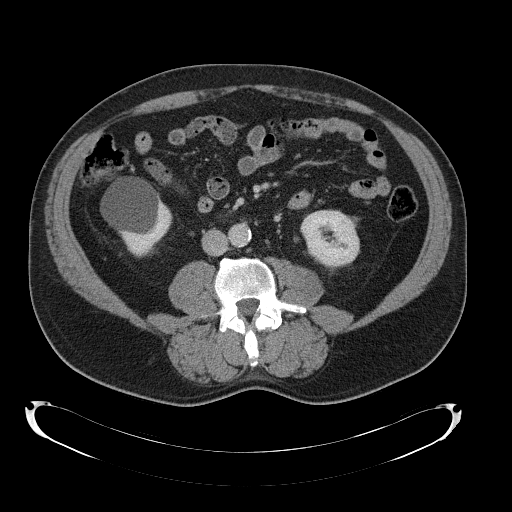
[im 67/106  soft-tissue]
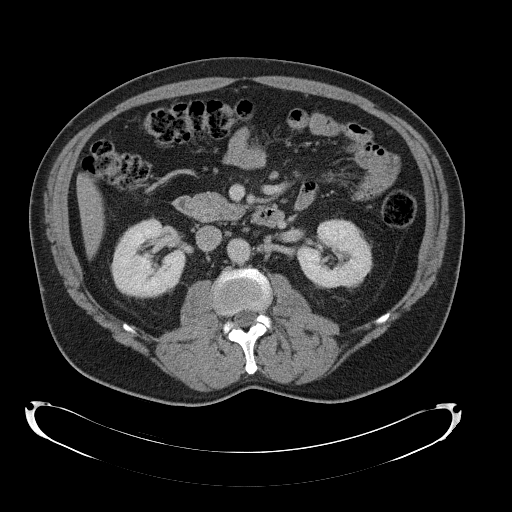
[im 67/106  lung]
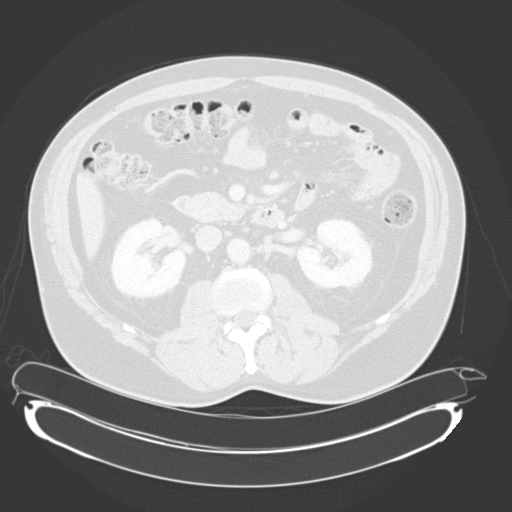
[im 77/106  soft-tissue]
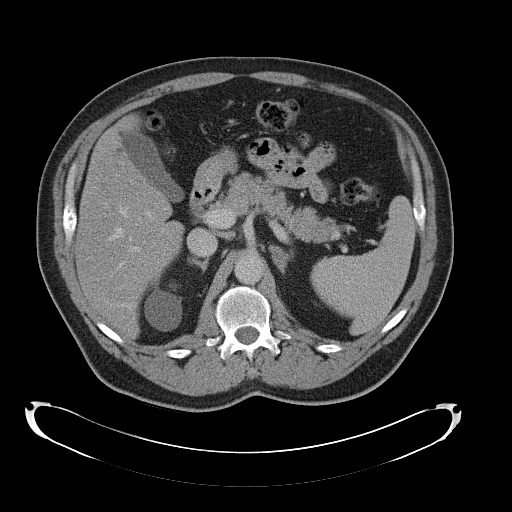
[im 77/106  lung]
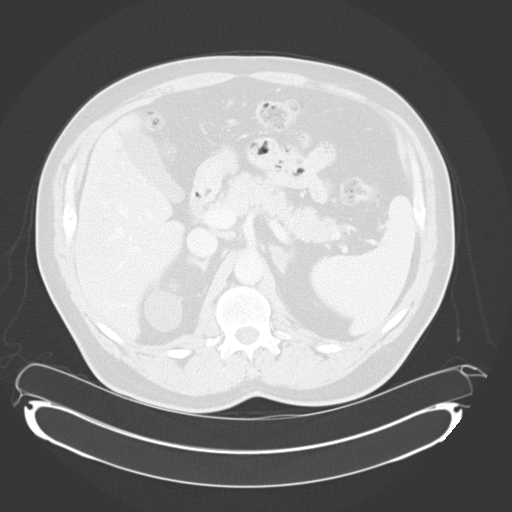
[im 86/106  soft-tissue]
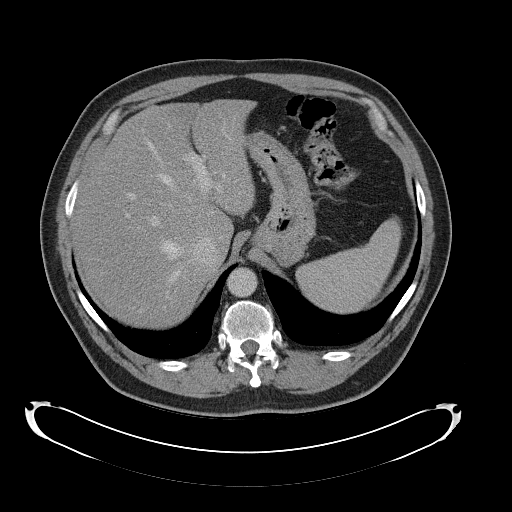
[im 86/106  lung]
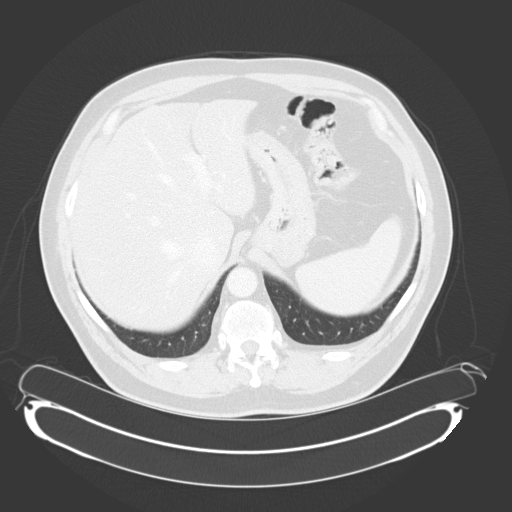
[im 96/106  soft-tissue]
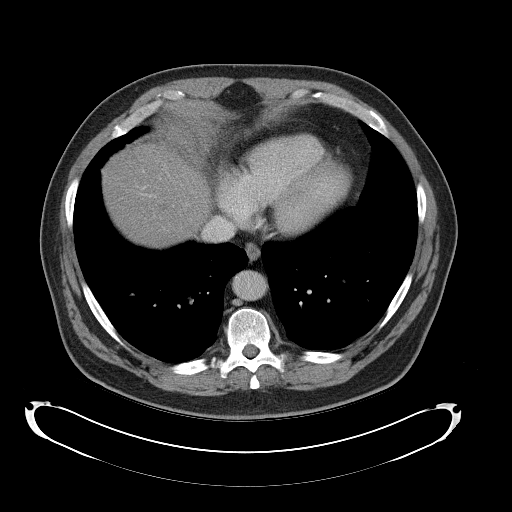
[im 96/106  lung]
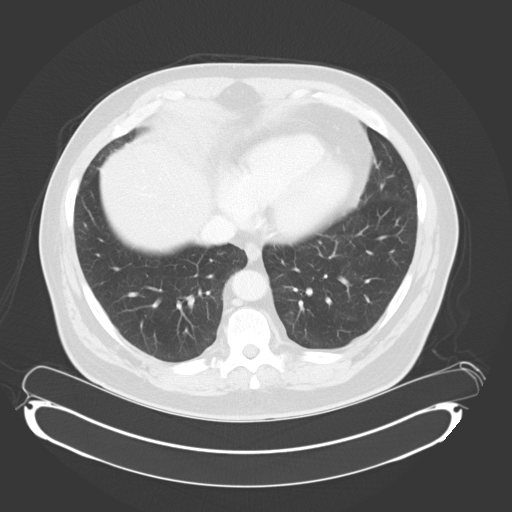
[im 96/106  bone]
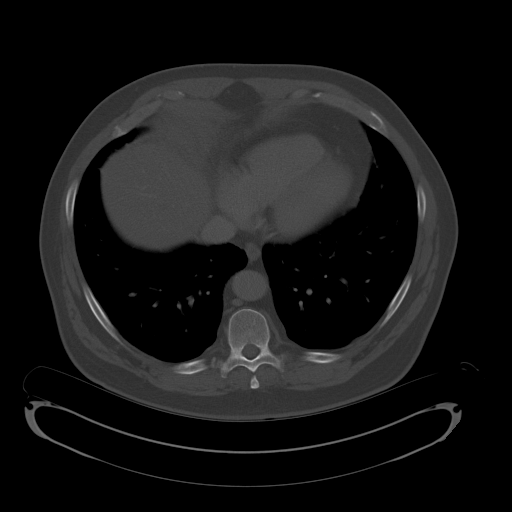

[Series 3: hematuria mpr cor post (id) · coronal · 0.82mm/px · 3 of 97 slices shown]
[im 25/97  soft-tissue]
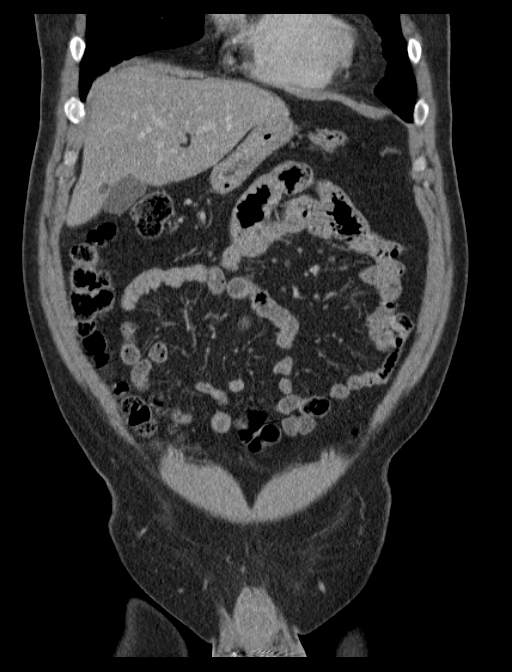
[im 49/97  soft-tissue]
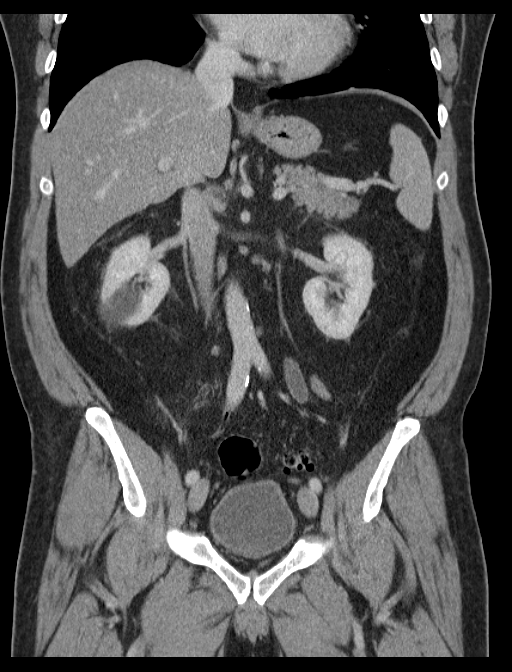
[im 73/97  soft-tissue]
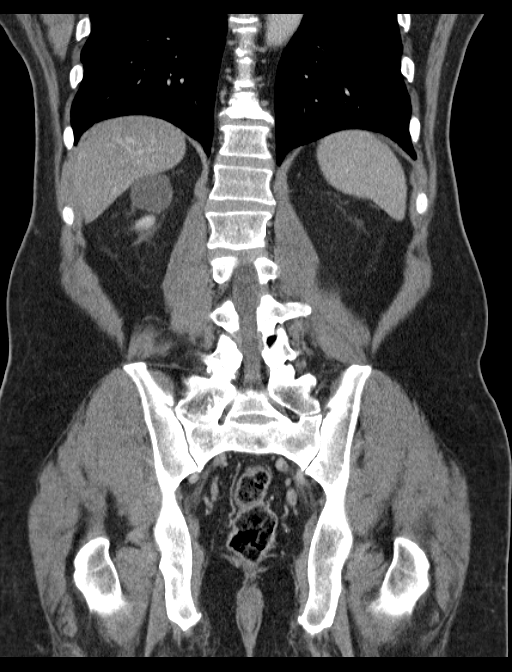

[12 of 46 positions shown; findings below may reference images not displayed]

FINDINGS: Lower chest:  No acute findings.

Hepatobiliary: Diffuse hepatic steatosis demonstrated. Probable tiny
sub-cm cysts are seen adjacent to the gallbladder fossa and in the
liver dome. No definite liver masses are identified. Gallbladder is
unremarkable.

Pancreas: No mass, inflammatory changes, or other significant
abnormality.

Spleen: Within normal limits in size and appearance.

Adrenals/Urinary Tract: No evidence of urolithiasis or
hydronephrosis. Several benign-appearing renal cysts are noted
bilaterally, however no complex cystic or solid masses are
identified. No masses are seen involving the ureters or bladder.

Stomach/Bowel: No evidence of obstruction, inflammatory process, or
abnormal fluid collections. Diverticulosis is seen predominately
involving the sigmoid colon, however there is no evidence of
diverticulitis. Normal appendix visualized.

Vascular/Lymphatic: No pathologically enlarged lymph nodes. No
evidence of abdominal aortic aneurysm.

Reproductive: Mildly enlarged prostate gland, with mass effect on
bladder base.

Other: None.

Musculoskeletal:  No suspicious bone lesions identified.
IMPRESSION: No radiographic evidence of urinary tract carcinoma, urolithiasis,
or hydronephrosis.

Mildly enlarged prostate.

Colonic diverticulosis. No radiographic evidence of diverticulitis.

Hepatic steatosis.

## 2017-01-11 ENCOUNTER — Ambulatory Visit (INDEPENDENT_AMBULATORY_CARE_PROVIDER_SITE_OTHER): Payer: Medicare Other | Admitting: Orthopaedic Surgery

## 2017-08-22 IMAGING — RF DG HIP (WITH PELVIS) OPERATIVE*L*
1 series · 2 of 2 positions shown · non-contrast
Comparison: February 18, 2015

CLINICAL DATA: Intraoperative view during a left hip replacement
for osteoarthritis.

FLUOROSCOPY TIME:  27 seconds.
EXAM:
OPERATIVE LEFT HIP (WITH PELVIS IF PERFORMED) 2 VIEWS
TECHNIQUE: Fluoroscopic spot image(s) were submitted for interpretation
post-operatively.

[Series 1: run · 2 of 2 slices shown]
[im 1/2]
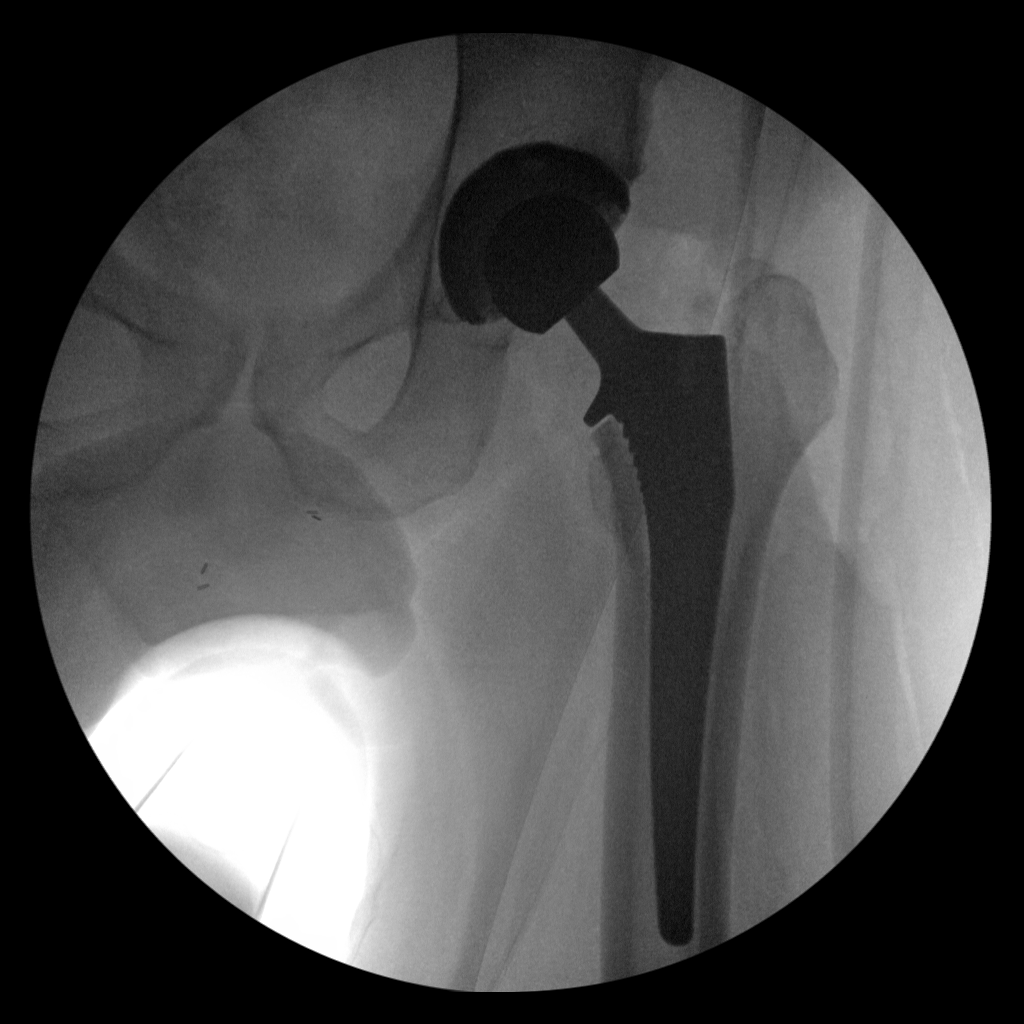
[im 2/2]
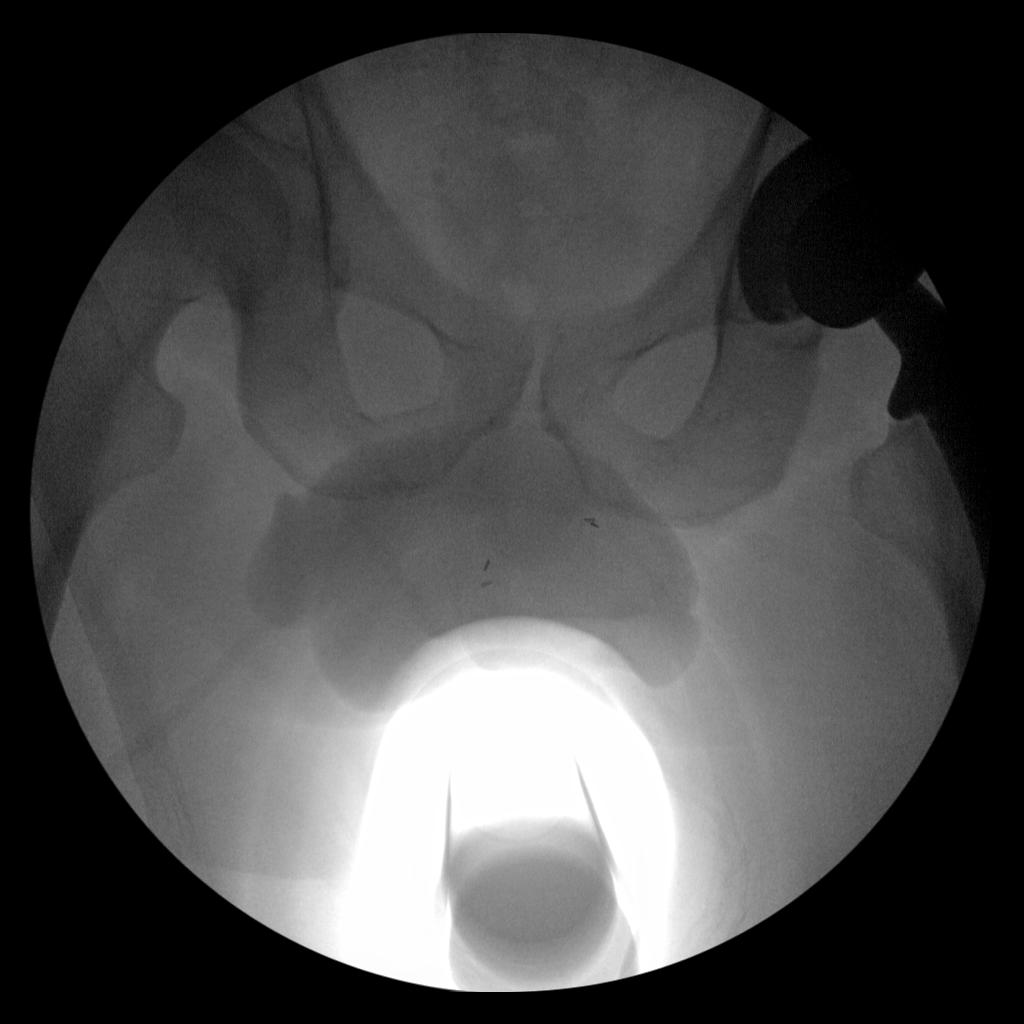

[2 of 2 positions shown; findings below may reference images not displayed]

FINDINGS: The patient is status post left hip replacement. The acetabular and
femoral components are in good position.
IMPRESSION: Two views obtained during left hip replacement as above.

## 2018-12-20 ENCOUNTER — Other Ambulatory Visit: Payer: Medicare Other

## 2018-12-20 ENCOUNTER — Other Ambulatory Visit: Payer: Self-pay | Admitting: Internal Medicine

## 2018-12-20 DIAGNOSIS — Z20822 Contact with and (suspected) exposure to covid-19: Secondary | ICD-10-CM

## 2018-12-22 LAB — NOVEL CORONAVIRUS, NAA: SARS-CoV-2, NAA: NOT DETECTED

## 2019-01-12 ENCOUNTER — Other Ambulatory Visit: Payer: Self-pay

## 2019-01-12 ENCOUNTER — Other Ambulatory Visit: Payer: Medicare Other

## 2019-01-12 DIAGNOSIS — Z20822 Contact with and (suspected) exposure to covid-19: Secondary | ICD-10-CM

## 2019-01-15 LAB — NOVEL CORONAVIRUS, NAA: SARS-CoV-2, NAA: NOT DETECTED

## 2019-04-10 ENCOUNTER — Other Ambulatory Visit: Payer: Self-pay

## 2019-04-10 DIAGNOSIS — Z20822 Contact with and (suspected) exposure to covid-19: Secondary | ICD-10-CM

## 2019-04-11 LAB — NOVEL CORONAVIRUS, NAA: SARS-CoV-2, NAA: NOT DETECTED

## 2019-05-18 ENCOUNTER — Other Ambulatory Visit: Payer: Self-pay

## 2019-05-18 DIAGNOSIS — Z20822 Contact with and (suspected) exposure to covid-19: Secondary | ICD-10-CM

## 2019-05-21 LAB — NOVEL CORONAVIRUS, NAA: SARS-CoV-2, NAA: NOT DETECTED

## 2019-07-18 ENCOUNTER — Ambulatory Visit: Payer: Medicare PPO | Attending: Internal Medicine

## 2019-07-18 DIAGNOSIS — Z23 Encounter for immunization: Secondary | ICD-10-CM | POA: Insufficient documentation

## 2019-07-18 NOTE — Progress Notes (Signed)
   Covid-19 Vaccination Clinic  Name:  Anthony English    MRN: [id number] DOB: September 19, 1950  07/18/2019  [name] was observed post Covid-19 immunization for 15 minutes without incidence. He was provided with Vaccine Information Sheet and instruction to access the V-Safe system.   [name] was instructed to call 911 with any severe reactions post vaccine: Marland Kitchen Difficulty breathing  . Swelling of your face and throat  . A fast heartbeat  . A bad rash all over your body  . Dizziness and weakness    Immunizations Administered    Name Date Dose VIS Date Route   Pfizer COVID-19 Vaccine 07/18/2019 12:33 PM 0.3 mL 06/09/2019 Intramuscular   Manufacturer: Doreena Maulden   Lot: [id number]   [place]: [id number]

## 2019-08-07 ENCOUNTER — Ambulatory Visit: Payer: Medicare PPO | Attending: Internal Medicine

## 2019-08-07 DIAGNOSIS — Z23 Encounter for immunization: Secondary | ICD-10-CM | POA: Insufficient documentation

## 2019-08-07 NOTE — Progress Notes (Signed)
   Covid-19 Vaccination Clinic  Name:  TERON KOSTOFF    MRN: [id number] DOB: May 07, 1951  08/07/2019  Mr. Pippin was observed post Covid-19 immunization for 15 minutes without incidence. He was provided with Vaccine Information Sheet and instruction to access the V-Safe system.   Mr. Dema was instructed to call 911 with any severe reactions post vaccine: Marland Kitchen Difficulty breathing  . Swelling of your face and throat  . A fast heartbeat  . A bad rash all over your body  . Dizziness and weakness    Immunizations Administered    Name Date Dose VIS Date Route   Pfizer COVID-19 Vaccine 08/07/2019 12:33 PM 0.3 mL 06/09/2019 Intramuscular   Manufacturer: [place]   Lot: [id number]   [place]: [id number]

## 2019-12-15 DIAGNOSIS — M1000 Idiopathic gout, unspecified site: Secondary | ICD-10-CM | POA: Diagnosis not present

## 2019-12-15 DIAGNOSIS — I10 Essential (primary) hypertension: Secondary | ICD-10-CM | POA: Diagnosis not present

## 2020-01-11 DIAGNOSIS — H9313 Tinnitus, bilateral: Secondary | ICD-10-CM | POA: Diagnosis not present

## 2020-01-11 DIAGNOSIS — H6123 Impacted cerumen, bilateral: Secondary | ICD-10-CM | POA: Diagnosis not present

## 2020-01-11 DIAGNOSIS — H903 Sensorineural hearing loss, bilateral: Secondary | ICD-10-CM | POA: Diagnosis not present

## 2020-03-14 ENCOUNTER — Ambulatory Visit: Payer: Medicare Other | Attending: Internal Medicine

## 2020-03-14 DIAGNOSIS — Z23 Encounter for immunization: Secondary | ICD-10-CM

## 2020-03-14 NOTE — Progress Notes (Signed)
   Covid-19 Vaccination Clinic  Name:  Anthony English    MRN: [id number] DOB: 1950-11-05  03/14/2020  [name] was observed post Covid-19 immunization for 15 minutes without incident. He was provided with Vaccine Information Sheet and instruction to access the V-Safe system.   [name] was instructed to call 911 with any severe reactions post vaccine: Marland Kitchen Difficulty breathing  . Swelling of face and throat  . A fast heartbeat  . A bad rash all over body  . Dizziness and weakness

## 2020-05-06 DIAGNOSIS — H6123 Impacted cerumen, bilateral: Secondary | ICD-10-CM | POA: Diagnosis not present

## 2020-05-27 DIAGNOSIS — Z23 Encounter for immunization: Secondary | ICD-10-CM | POA: Diagnosis not present

## 2020-05-30 DIAGNOSIS — Z1283 Encounter for screening for malignant neoplasm of skin: Secondary | ICD-10-CM | POA: Diagnosis not present

## 2020-05-30 DIAGNOSIS — L820 Inflamed seborrheic keratosis: Secondary | ICD-10-CM | POA: Diagnosis not present

## 2020-05-30 DIAGNOSIS — D225 Melanocytic nevi of trunk: Secondary | ICD-10-CM | POA: Diagnosis not present

## 2020-05-30 DIAGNOSIS — Z08 Encounter for follow-up examination after completed treatment for malignant neoplasm: Secondary | ICD-10-CM | POA: Diagnosis not present

## 2020-05-30 DIAGNOSIS — Z85828 Personal history of other malignant neoplasm of skin: Secondary | ICD-10-CM | POA: Diagnosis not present

## 2020-06-17 ENCOUNTER — Encounter: Payer: Self-pay | Admitting: Podiatry

## 2020-06-17 ENCOUNTER — Other Ambulatory Visit: Payer: Self-pay

## 2020-06-17 ENCOUNTER — Ambulatory Visit (INDEPENDENT_AMBULATORY_CARE_PROVIDER_SITE_OTHER): Payer: Medicare Other

## 2020-06-17 ENCOUNTER — Ambulatory Visit (INDEPENDENT_AMBULATORY_CARE_PROVIDER_SITE_OTHER): Payer: Medicare Other | Admitting: Podiatry

## 2020-06-17 DIAGNOSIS — M7751 Other enthesopathy of right foot: Secondary | ICD-10-CM | POA: Diagnosis not present

## 2020-06-17 DIAGNOSIS — M79672 Pain in left foot: Secondary | ICD-10-CM | POA: Diagnosis not present

## 2020-06-17 DIAGNOSIS — M79671 Pain in right foot: Secondary | ICD-10-CM

## 2020-06-17 DIAGNOSIS — M2041 Other hammer toe(s) (acquired), right foot: Secondary | ICD-10-CM | POA: Diagnosis not present

## 2020-06-17 DIAGNOSIS — M779 Enthesopathy, unspecified: Secondary | ICD-10-CM | POA: Diagnosis not present

## 2020-06-17 DIAGNOSIS — M2042 Other hammer toe(s) (acquired), left foot: Secondary | ICD-10-CM

## 2020-06-17 MED ORDER — DICLOFENAC SODIUM 75 MG PO TBEC: 75.0000 mg | DELAYED_RELEASE_TABLET | Freq: Two times a day (BID) | ORAL | 2 refills | Status: DC

## 2020-06-17 MED ORDER — TRIAMCINOLONE ACETONIDE 10 MG/ML IJ SUSP
10.0000 mg | Freq: Once | INTRAMUSCULAR | Status: AC
  Administered 2020-06-17: 09:00:00 10 mg

## 2020-06-17 NOTE — Progress Notes (Signed)
Subjective:   Patient ID: Anthony English, male   DOB: 69 y.o.   MRN: [id number]   HPI Patient presents stating he has a lot more pain right over left foot and stated that he went hiking it seemed to occur after that several months ago.  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       Objective:  Physical Exam Vitals and nursing note reviewed.  Constitutional:      Appearance: He is well-developed and well-nourished.  Cardiovascular:     Pulses: Intact distal pulses.  Pulmonary:     Effort: Pulmonary effort is normal.  Musculoskeletal:        General: Normal range of motion.  Skin:    General: Skin is warm.  Neurological:     Mental Status: He is alert.     Neurovascular status was found to be intact muscle strength was adequate range of motion adequate.  Patient is found to have exquisite discomfort second metatarsal pharyngeal joint right over left foot with inflammation around the joint surface moderate digital deformities good range of motion subtalar midtarsal joint.  Patient is found to have good digital perfusion and is well oriented x3      Assessment:  Acute capsulitis second MPJ right over left     Plan:  H&P conditions reviewed and will get a focus on the right 1.  I did sterile prep I anesthetized 60 mg like Marcaine mixture aspirated the second MPJ getting out a small amount of clear fluid and injected with 1/4 cc dexamethasone Kenalog along with thick plantar padding.  Patient had padding on the left was placed on diclofenac 75 mg twice daily will be seen back in 3 weeks or earlier if needed may require orthotics to offload weight  X-rays indicate that there is no indications arthritis or stress fracture with moderate digital deformity bilateral

## 2020-06-18 ENCOUNTER — Ambulatory Visit: Payer: Medicare Other | Admitting: Podiatry

## 2020-06-19 ENCOUNTER — Other Ambulatory Visit: Payer: Self-pay | Admitting: Podiatry

## 2020-06-19 DIAGNOSIS — M779 Enthesopathy, unspecified: Secondary | ICD-10-CM

## 2020-06-24 DIAGNOSIS — I10 Essential (primary) hypertension: Secondary | ICD-10-CM | POA: Diagnosis not present

## 2020-06-24 DIAGNOSIS — E785 Hyperlipidemia, unspecified: Secondary | ICD-10-CM | POA: Diagnosis not present

## 2020-06-24 DIAGNOSIS — M109 Gout, unspecified: Secondary | ICD-10-CM | POA: Diagnosis not present

## 2020-06-24 DIAGNOSIS — Z79899 Other long term (current) drug therapy: Secondary | ICD-10-CM | POA: Diagnosis not present

## 2020-07-02 DIAGNOSIS — Z0000 Encounter for general adult medical examination without abnormal findings: Secondary | ICD-10-CM | POA: Diagnosis not present

## 2020-07-02 DIAGNOSIS — M1009 Idiopathic gout, multiple sites: Secondary | ICD-10-CM | POA: Diagnosis not present

## 2020-07-02 DIAGNOSIS — S81811A Laceration without foreign body, right lower leg, initial encounter: Secondary | ICD-10-CM | POA: Diagnosis not present

## 2020-07-02 DIAGNOSIS — E785 Hyperlipidemia, unspecified: Secondary | ICD-10-CM | POA: Diagnosis not present

## 2020-07-02 DIAGNOSIS — I10 Essential (primary) hypertension: Secondary | ICD-10-CM | POA: Diagnosis not present

## 2020-07-04 ENCOUNTER — Other Ambulatory Visit: Payer: Medicare Other

## 2020-07-04 DIAGNOSIS — Z20822 Contact with and (suspected) exposure to covid-19: Secondary | ICD-10-CM | POA: Diagnosis not present

## 2020-07-06 LAB — SARS-COV-2, NAA 2 DAY TAT

## 2020-07-06 LAB — SPECIMEN STATUS REPORT

## 2020-07-06 LAB — NOVEL CORONAVIRUS, NAA: SARS-CoV-2, NAA: NOT DETECTED

## 2020-07-10 ENCOUNTER — Ambulatory Visit: Payer: Medicare Other | Admitting: Podiatry

## 2020-07-10 ENCOUNTER — Other Ambulatory Visit: Payer: Self-pay

## 2020-07-10 DIAGNOSIS — M2042 Other hammer toe(s) (acquired), left foot: Secondary | ICD-10-CM | POA: Diagnosis not present

## 2020-07-10 DIAGNOSIS — M779 Enthesopathy, unspecified: Secondary | ICD-10-CM

## 2020-07-10 DIAGNOSIS — M2041 Other hammer toe(s) (acquired), right foot: Secondary | ICD-10-CM

## 2020-07-10 NOTE — Progress Notes (Signed)
Subjective:   Patient ID: Anthony English, male   DOB: 70 y.o.   MRN: [id number]   HPI Patient presents stating that he still is having pain but has improved and the padding helps him quite a bit and he wants to stay active   ROS      Objective:  Physical Exam  Neurovascular status intact with inflammation still present second MPJ right over left with pain about 70% better but still sore around the joint surface     Assessment:  Inflammatory capsulitis second MPJ right over left with patient who has numerous questions concerning shoe gear     Plan:  H&P reviewed condition and at this point I recommended orthotics with significant offloading around the second MPJ right over left to try to take pressure off the joint surfaces along with shoe gear education.  I discussed this with him at great length answered a lot of different questions and we have a plan for him to see ped orthotist for orthotic casting and recommendations on shoes and he is to bring in several different types of shoes with him

## 2020-07-11 ENCOUNTER — Other Ambulatory Visit: Payer: Medicare Other

## 2020-07-11 ENCOUNTER — Other Ambulatory Visit: Payer: Self-pay

## 2020-07-11 DIAGNOSIS — Z20822 Contact with and (suspected) exposure to covid-19: Secondary | ICD-10-CM

## 2020-07-12 ENCOUNTER — Ambulatory Visit: Payer: Medicare Other | Admitting: Orthotics

## 2020-07-12 ENCOUNTER — Other Ambulatory Visit: Payer: Self-pay

## 2020-07-12 DIAGNOSIS — M779 Enthesopathy, unspecified: Secondary | ICD-10-CM

## 2020-07-12 DIAGNOSIS — M2041 Other hammer toe(s) (acquired), right foot: Secondary | ICD-10-CM

## 2020-07-12 DIAGNOSIS — M2042 Other hammer toe(s) (acquired), left foot: Secondary | ICD-10-CM

## 2020-07-12 NOTE — Progress Notes (Signed)
Patient didn't want f/o after he found out that his Medicare advantage plan will not pay for them.  He quickly said he wasn't in that much pain to justify the expense.

## 2020-07-14 LAB — SARS-COV-2, NAA 2 DAY TAT

## 2020-07-14 LAB — NOVEL CORONAVIRUS, NAA: SARS-CoV-2, NAA: NOT DETECTED

## 2020-09-02 DIAGNOSIS — H6123 Impacted cerumen, bilateral: Secondary | ICD-10-CM | POA: Diagnosis not present

## 2020-09-02 DIAGNOSIS — H902 Conductive hearing loss, unspecified: Secondary | ICD-10-CM | POA: Diagnosis not present

## 2020-10-21 DIAGNOSIS — H16141 Punctate keratitis, right eye: Secondary | ICD-10-CM | POA: Diagnosis not present

## 2020-10-21 DIAGNOSIS — H2513 Age-related nuclear cataract, bilateral: Secondary | ICD-10-CM | POA: Diagnosis not present

## 2020-10-21 DIAGNOSIS — S0501XA Injury of conjunctiva and corneal abrasion without foreign body, right eye, initial encounter: Secondary | ICD-10-CM | POA: Diagnosis not present

## 2020-10-25 DIAGNOSIS — H16141 Punctate keratitis, right eye: Secondary | ICD-10-CM | POA: Diagnosis not present

## 2020-10-25 DIAGNOSIS — H2513 Age-related nuclear cataract, bilateral: Secondary | ICD-10-CM | POA: Diagnosis not present

## 2020-10-25 DIAGNOSIS — S0501XA Injury of conjunctiva and corneal abrasion without foreign body, right eye, initial encounter: Secondary | ICD-10-CM | POA: Diagnosis not present

## 2021-01-01 DIAGNOSIS — I700 Atherosclerosis of aorta: Secondary | ICD-10-CM | POA: Diagnosis not present

## 2021-01-01 DIAGNOSIS — I10 Essential (primary) hypertension: Secondary | ICD-10-CM | POA: Diagnosis not present

## 2021-01-01 DIAGNOSIS — M25552 Pain in left hip: Secondary | ICD-10-CM | POA: Diagnosis not present

## 2021-02-11 ENCOUNTER — Ambulatory Visit: Payer: Medicare Other | Admitting: Orthopaedic Surgery

## 2021-02-24 ENCOUNTER — Ambulatory Visit: Payer: Medicare Other | Admitting: Orthopaedic Surgery

## 2021-03-10 ENCOUNTER — Ambulatory Visit: Payer: Medicare Other | Admitting: Physician Assistant

## 2021-03-25 ENCOUNTER — Encounter (INDEPENDENT_AMBULATORY_CARE_PROVIDER_SITE_OTHER): Payer: Self-pay | Admitting: *Deleted

## 2021-04-21 DIAGNOSIS — Z23 Encounter for immunization: Secondary | ICD-10-CM | POA: Diagnosis not present

## 2021-04-23 DIAGNOSIS — H2513 Age-related nuclear cataract, bilateral: Secondary | ICD-10-CM | POA: Diagnosis not present

## 2021-05-14 DIAGNOSIS — M25552 Pain in left hip: Secondary | ICD-10-CM | POA: Diagnosis not present

## 2021-05-21 ENCOUNTER — Ambulatory Visit: Payer: Medicare Other | Admitting: Physician Assistant

## 2021-06-05 DIAGNOSIS — Z08 Encounter for follow-up examination after completed treatment for malignant neoplasm: Secondary | ICD-10-CM | POA: Diagnosis not present

## 2021-06-05 DIAGNOSIS — L820 Inflamed seborrheic keratosis: Secondary | ICD-10-CM | POA: Diagnosis not present

## 2021-06-05 DIAGNOSIS — Z85828 Personal history of other malignant neoplasm of skin: Secondary | ICD-10-CM | POA: Diagnosis not present

## 2021-06-05 DIAGNOSIS — X32XXXD Exposure to sunlight, subsequent encounter: Secondary | ICD-10-CM | POA: Diagnosis not present

## 2021-06-05 DIAGNOSIS — D225 Melanocytic nevi of trunk: Secondary | ICD-10-CM | POA: Diagnosis not present

## 2021-06-05 DIAGNOSIS — Z1283 Encounter for screening for malignant neoplasm of skin: Secondary | ICD-10-CM | POA: Diagnosis not present

## 2021-06-05 DIAGNOSIS — L570 Actinic keratosis: Secondary | ICD-10-CM | POA: Diagnosis not present

## 2021-07-01 DIAGNOSIS — M109 Gout, unspecified: Secondary | ICD-10-CM | POA: Diagnosis not present

## 2021-07-01 DIAGNOSIS — Z79899 Other long term (current) drug therapy: Secondary | ICD-10-CM | POA: Diagnosis not present

## 2021-07-01 DIAGNOSIS — E785 Hyperlipidemia, unspecified: Secondary | ICD-10-CM | POA: Diagnosis not present

## 2021-07-01 DIAGNOSIS — I10 Essential (primary) hypertension: Secondary | ICD-10-CM | POA: Diagnosis not present

## 2021-07-07 DIAGNOSIS — M109 Gout, unspecified: Secondary | ICD-10-CM | POA: Diagnosis not present

## 2021-07-07 DIAGNOSIS — Z0000 Encounter for general adult medical examination without abnormal findings: Secondary | ICD-10-CM | POA: Diagnosis not present

## 2021-07-07 DIAGNOSIS — I10 Essential (primary) hypertension: Secondary | ICD-10-CM | POA: Diagnosis not present

## 2021-07-07 DIAGNOSIS — E785 Hyperlipidemia, unspecified: Secondary | ICD-10-CM | POA: Diagnosis not present

## 2021-07-08 DIAGNOSIS — H903 Sensorineural hearing loss, bilateral: Secondary | ICD-10-CM | POA: Diagnosis not present

## 2021-07-08 DIAGNOSIS — H6123 Impacted cerumen, bilateral: Secondary | ICD-10-CM | POA: Diagnosis not present

## 2021-07-29 ENCOUNTER — Encounter (INDEPENDENT_AMBULATORY_CARE_PROVIDER_SITE_OTHER): Payer: Self-pay

## 2021-07-29 ENCOUNTER — Telehealth (INDEPENDENT_AMBULATORY_CARE_PROVIDER_SITE_OTHER): Payer: Self-pay

## 2021-07-29 ENCOUNTER — Other Ambulatory Visit (INDEPENDENT_AMBULATORY_CARE_PROVIDER_SITE_OTHER): Payer: Self-pay

## 2021-07-29 DIAGNOSIS — Z86010 Personal history of colonic polyps: Secondary | ICD-10-CM

## 2021-07-29 MED ORDER — PEG 3350-KCL-NA BICARB-NACL 420 G PO SOLR: 4000.0000 mL | ORAL | 0 refills | Status: DC

## 2021-07-29 NOTE — Telephone Encounter (Signed)
Referring MD/PCP: Willey Blade  Procedure: Tcs  Reason/Indication:  Hx of colon polyps  Has patient had this procedure before?  yes  If so, when, by whom and where?  03/2016  Is there a family history of colon cancer?  no  Who?  What age when diagnosed?    Is patient diabetic? If yes, Type 1 or Type 2   no      Does patient have prosthetic heart valve or mechanical valve?  no  Do you have a pacemaker/defibrillator?  no  Has patient ever had endocarditis/atrial fibrillation? no  Does patient use oxygen? no  Has patient had joint replacement within last 12 months?  no  Is patient constipated or do they take laxatives? no  Does patient have a history of alcohol/drug use?  no  Have you had a stroke/heart attack last 6 mths? no  Do you take medicine for weight loss?  no  For male patients,: have you had a hysterectomy N/A                      are you post menopausal N/A                      do you still have your menstrual cycle N/A  Is patient on blood thinner such as Coumadin, Plavix and/or Aspirin? no  Medications: allopurinol 450 mg daily, pravastatin 40 mg daily, lisinopril 5 mg daily  Allergies: nkda  Medication Adjustment per Dr Laural Golden none  Procedure date & time: Thursday 08/07/21 9:10 am

## 2021-07-29 NOTE — Telephone Encounter (Signed)
Anthony English Rodric Punch, CMA  ?

## 2021-08-04 ENCOUNTER — Other Ambulatory Visit (INDEPENDENT_AMBULATORY_CARE_PROVIDER_SITE_OTHER): Payer: Self-pay

## 2021-08-07 ENCOUNTER — Encounter (HOSPITAL_COMMUNITY): Payer: Self-pay | Admitting: Internal Medicine

## 2021-08-07 ENCOUNTER — Ambulatory Visit (HOSPITAL_COMMUNITY)
Admission: RE | Admit: 2021-08-07 | Discharge: 2021-08-07 | Disposition: A | Discharge status: Home / Self Care | Payer: Medicare Other | Attending: Internal Medicine | Admitting: Internal Medicine

## 2021-08-07 ENCOUNTER — Ambulatory Visit (HOSPITAL_BASED_OUTPATIENT_CLINIC_OR_DEPARTMENT_OTHER): Payer: Medicare Other | Admitting: Certified Registered Nurse Anesthetist

## 2021-08-07 ENCOUNTER — Encounter (HOSPITAL_COMMUNITY)
Admission: RE | Disposition: A | Discharge status: Home / Self Care | Payer: Self-pay | Source: Home / Self Care | Attending: Internal Medicine

## 2021-08-07 ENCOUNTER — Other Ambulatory Visit: Payer: Self-pay

## 2021-08-07 ENCOUNTER — Ambulatory Visit (HOSPITAL_COMMUNITY): Payer: Medicare Other | Admitting: Certified Registered Nurse Anesthetist

## 2021-08-07 DIAGNOSIS — Z1211 Encounter for screening for malignant neoplasm of colon: Secondary | ICD-10-CM | POA: Diagnosis not present

## 2021-08-07 DIAGNOSIS — I10 Essential (primary) hypertension: Secondary | ICD-10-CM | POA: Diagnosis not present

## 2021-08-07 DIAGNOSIS — D125 Benign neoplasm of sigmoid colon: Secondary | ICD-10-CM | POA: Insufficient documentation

## 2021-08-07 DIAGNOSIS — Z86010 Personal history of colonic polyps: Secondary | ICD-10-CM

## 2021-08-07 DIAGNOSIS — D123 Benign neoplasm of transverse colon: Secondary | ICD-10-CM | POA: Insufficient documentation

## 2021-08-07 DIAGNOSIS — K5730 Diverticulosis of large intestine without perforation or abscess without bleeding: Secondary | ICD-10-CM

## 2021-08-07 DIAGNOSIS — D120 Benign neoplasm of cecum: Secondary | ICD-10-CM | POA: Diagnosis not present

## 2021-08-07 DIAGNOSIS — K5700 Diverticulitis of small intestine with perforation and abscess without bleeding: Secondary | ICD-10-CM | POA: Diagnosis not present

## 2021-08-07 DIAGNOSIS — K635 Polyp of colon: Secondary | ICD-10-CM

## 2021-08-07 DIAGNOSIS — K644 Residual hemorrhoidal skin tags: Secondary | ICD-10-CM | POA: Insufficient documentation

## 2021-08-07 DIAGNOSIS — Z09 Encounter for follow-up examination after completed treatment for conditions other than malignant neoplasm: Secondary | ICD-10-CM | POA: Diagnosis not present

## 2021-08-07 HISTORY — PX: BIOPSY: SHX5522

## 2021-08-07 HISTORY — PX: COLONOSCOPY WITH PROPOFOL: SHX5780

## 2021-08-07 HISTORY — PX: POLYPECTOMY: SHX5525

## 2021-08-07 LAB — HM COLONOSCOPY

## 2021-08-07 SURGERY — COLONOSCOPY WITH PROPOFOL
Anesthesia: General

## 2021-08-07 MED ORDER — PROPOFOL 500 MG/50ML IV EMUL
INTRAVENOUS | Status: AC
  Filled 2021-08-07: qty 50

## 2021-08-07 MED ORDER — PROPOFOL 500 MG/50ML IV EMUL
INTRAVENOUS | Status: DC | PRN
  Administered 2021-08-07: 160 ug/kg/min via INTRAVENOUS

## 2021-08-07 MED ORDER — PROPOFOL 10 MG/ML IV BOLUS
INTRAVENOUS | Status: DC | PRN
  Administered 2021-08-07: 60 mg via INTRAVENOUS

## 2021-08-07 MED ORDER — LACTATED RINGERS IV SOLN: INTRAVENOUS | Status: DC | PRN

## 2021-08-07 MED ORDER — LACTATED RINGERS IV SOLN: INTRAVENOUS | Status: DC

## 2021-08-07 NOTE — Discharge Instructions (Signed)
No aspirin or NSAIDs for 24 hours Resume usual medications. High-fiber diet No driving for 24 hours Physician will call with biopsy results.

## 2021-08-07 NOTE — Transfer of Care (Signed)
Immediate Anesthesia Transfer of Care Note  Patient: Anthony English  Procedure(s) Performed: COLONOSCOPY WITH PROPOFOL BIOPSY POLYPECTOMY  Patient Location: PACU  Anesthesia Type:General  Level of Consciousness: drowsy, patient cooperative and responds to stimulation  Airway & Oxygen Therapy: Patient Spontanous Breathing and Patient connected to nasal cannula oxygen  Post-op Assessment: Report given to RN, Post -op Vital signs reviewed and stable and Patient moving all extremities X 4  Post vital signs: Reviewed  Last Vitals:  Vitals Value Taken Time  BP 96/25   Temp 97.6   Pulse 72   Resp 15   SpO2 97     Last Pain:  Vitals:   08/07/21 0916  TempSrc:   PainSc: 0-No pain         Complications: No notable events documented.

## 2021-08-07 NOTE — Anesthesia Preprocedure Evaluation (Signed)
Anesthesia Evaluation  Patient identified by MRN, date of birth, ID band Patient awake    Reviewed: Allergy & Precautions, H&P , NPO status , Patient's Chart, lab work & pertinent test results, reviewed documented beta blocker date and time   Airway Mallampati: II  TM Distance: >3 FB Neck ROM: full    Dental no notable dental hx.    Pulmonary neg pulmonary ROS,    Pulmonary exam normal breath sounds clear to auscultation       Cardiovascular Exercise Tolerance: Good hypertension, negative cardio ROS   Rhythm:regular Rate:Normal     Neuro/Psych negative neurological ROS  negative psych ROS   GI/Hepatic negative GI ROS, Neg liver ROS,   Endo/Other  negative endocrine ROS  Renal/GU negative Renal ROS  negative genitourinary   Musculoskeletal   Abdominal   Peds  Hematology negative hematology ROS (+)   Anesthesia Other Findings   Reproductive/Obstetrics negative OB ROS                             Anesthesia Physical Anesthesia Plan  ASA: 2  Anesthesia Plan: General   Post-op Pain Management:    Induction:   PONV Risk Score and Plan: Propofol infusion  Airway Management Planned:   Additional Equipment:   Intra-op Plan:   Post-operative Plan:   Informed Consent: I have reviewed the patients History and Physical, chart, labs and discussed the procedure including the risks, benefits and alternatives for the proposed anesthesia with the patient or authorized representative who has indicated his/her understanding and acceptance.     Dental Advisory Given  Plan Discussed with: CRNA  Anesthesia Plan Comments:         Anesthesia Quick Evaluation  

## 2021-08-07 NOTE — Op Note (Signed)
[facility] Patient Name: Anthony English Procedure Date: 08/07/2021 8:59 AM MRN: [id number] Date of Birth: March 21, 1951 Attending MD: Hildred Laser , MD CSN: [id number] Age: 71 Admit Type: Outpatient Procedure:                Colonoscopy Indications:              High risk colon cancer surveillance: Personal                            history of colonic polyps Providers:                Hildred Laser, MD, Hughie Closs RN, RN, Lambert Mody, Randa Spike, Technician Referring MD:             Asencion Noble, MD Medicines:                Propofol per Anesthesia Complications:            No immediate complications. Estimated Blood Loss:     Estimated blood loss was minimal. Procedure:                Pre-Anesthesia Assessment:                           - Prior to the procedure, a History and Physical                            was performed, and patient medications and                            allergies were reviewed. The patient's tolerance of                            previous anesthesia was also reviewed. The risks                            and benefits of the procedure and the sedation                            options and risks were discussed with the patient.                            All questions were answered, and informed consent                            was obtained. Prior Anticoagulants: The patient has                            taken no previous anticoagulant or antiplatelet                            agents. ASA Grade Assessment: II - A patient with  mild systemic disease. After reviewing the risks                            and benefits, the patient was deemed in                            satisfactory condition to undergo the procedure.                           After obtaining informed consent, the colonoscope                            was passed under direct vision. Throughout the                             procedure, the patient's blood pressure, pulse, and                            oxygen saturations were monitored continuously. The                            PCF-HQ190L ([id number]) scope was introduced through                            the anus and advanced to the the cecum, identified                            by appendiceal orifice and ileocecal valve. The                            colonoscopy was performed without difficulty. The                            patient tolerated the procedure well. The quality                            of the bowel preparation was good. The ileocecal                            valve, appendiceal orifice, and rectum were                            photographed. Scope In: 9:22:06 AM Scope Out: 9:40:15 AM Scope Withdrawal Time: 0 hours 12 minutes 51 seconds  Total Procedure Duration: 0 hours 18 minutes 9 seconds  Findings:      The perianal and digital rectal examinations were normal.      A diminutive polyp was found in the ileocecal valve. Biopsies were taken       with a cold forceps for histology. The pathology specimen was placed       into Bottle Number 1.      Two polyps were found in the sigmoid colon and transverse colon. The       polyps were small in size. These polyps were removed with a cold snare.  Resection and retrieval were complete. The pathology specimen was placed       into Bottle Number 1.      Multiple small and large-mouthed diverticula were found in the sigmoid       colon.      External hemorrhoids were found during retroflexion. The hemorrhoids       were small. Impression:               - One diminutive polyp at the ileocecal valve.                            Biopsied.                           - Two small polyps in the sigmoid colon and in the                            transverse colon, removed with a cold snare.                            Resected and retrieved.                           - Diverticulosis in the sigmoid  colon.                           - External hemorrhoids. Moderate Sedation:      Per Anesthesia Care Recommendation:           - Patient has a contact number available for                            emergencies. The signs and symptoms of potential                            delayed complications were discussed with the                            patient. Return to normal activities tomorrow.                            Written discharge instructions were provided to the                            patient.                           - High fiber diet today.                           - Continue present medications.                           - No aspirin, ibuprofen, naproxen, or other                            non-steroidal anti-inflammatory drugs for 1 day.                           -  Await pathology results.                           - Repeat colonoscopy is recommended. The                            colonoscopy date will be determined after pathology                            results from today's exam become available for                            review. Procedure Code(s):        --- Professional ---                           [phone], Colonoscopy, flexible; with removal of                            tumor(s), polyp(s), or other lesion(s) by snare                            technique                           45380, 61, Colonoscopy, flexible; with biopsy,                            single or multiple Diagnosis Code(s):        --- Professional ---                           K63.5, Polyp of colon                           Z86.010, Personal history of colonic polyps                           K64.4, Residual hemorrhoidal skin tags                           K57.30, Diverticulosis of large intestine without                            perforation or abscess without bleeding CPT copyright 2019 American Medical Association. All rights reserved. The codes documented in this report are preliminary  and upon coder review may  be revised to meet current compliance requirements. Hildred Laser, MD Hildred Laser, MD 08/07/2021 9:48:30 AM This report has been signed electronically. Number of Addenda: 0

## 2021-08-07 NOTE — H&P (Signed)
Anthony English is an 71 y.o. male.   Chief Complaint: Patient is here for colonoscopy HPI: Patient is 71 year old Caucasian male who is here for surveillance colonoscopy.  He has a history of colonic adenomas.  Last colonoscopy was in October 2017 with removal of 4 polyps and 3 were tubular adenomas.  Patient denies abdominal pain change in bowel habits or rectal bleeding.  His appetite is good.  His weight stable.  He stays busy. Family history is negative for colorectal cancer. Patient does not take aspirin NSAIDs or anticoagulants.  Past Medical History:  Diagnosis Date   Arthritis    Gout    Hypercholesteremia    Hypertension     Past Surgical History:  Procedure Laterality Date   COLONOSCOPY     COLONOSCOPY     COLONOSCOPY N/A 04/02/2016   Procedure: COLONOSCOPY;  Surgeon: Rogene Houston, MD;  Location: [facility];  Service: Endoscopy;  Laterality: N/A;  1230   TOTAL HIP ARTHROPLASTY Left 05/29/2016   Procedure: LEFT TOTAL HIP ARTHROPLASTY ANTERIOR APPROACH;  Surgeon: Mcarthur Rossetti, MD;  Location: [facility];  Service: Orthopedics;  Laterality: Left;   VASECTOMY      Family History  Problem Relation Age of Onset   Colon cancer Neg Hx    Social History:  reports that he has never smoked. He quit smokeless tobacco use about 31 years ago. He reports current alcohol use. He reports that he does not use drugs.  Allergies: No Known Allergies  Medications Prior to Admission  Medication Sig Dispense Refill   allopurinol (ZYLOPRIM) 300 MG tablet Take 450 mg by mouth daily.      diphenhydramine-acetaminophen (TYLENOL PM) 25-500 MG TABS tablet Take 1 tablet by mouth at bedtime as needed (sleep).     lisinopril (ZESTRIL) 10 MG tablet Take 5 mg by mouth daily.     polyethylene glycol-electrolytes (TRILYTE) 420 g solution Take 4,000 mLs by mouth as directed. 4000 mL 0   pravastatin (PRAVACHOL) 40 MG tablet Take 40 mg by mouth daily.     sodium chloride (OCEAN) 0.65 % SOLN nasal  spray Place 1 spray into both nostrils 3 (three) times daily as needed for congestion.      No results found for this or any previous visit (from the past 48 hour(s)). No results found.  Review of Systems  Blood pressure 118/89, pulse 73, temperature 98 F (36.7 C), temperature source Oral, resp. rate 13, height 6' (1.829 m), weight 110.7 kg, SpO2 96 %. Physical Exam HENT:     Mouth/Throat:     Mouth: Mucous membranes are moist.     Pharynx: Oropharynx is clear.  Eyes:     General: No scleral icterus.    Conjunctiva/sclera: Conjunctivae normal.  Cardiovascular:     Rate and Rhythm: Normal rate and regular rhythm.     Heart sounds: Normal heart sounds. No murmur heard. Pulmonary:     Effort: Pulmonary effort is normal.     Breath sounds: Normal breath sounds.  Abdominal:     General: There is no distension.     Palpations: Abdomen is soft. There is no mass.     Tenderness: There is no abdominal tenderness.  Musculoskeletal:        General: No swelling.     Cervical back: Neck supple.  Lymphadenopathy:     Cervical: No cervical adenopathy.  Skin:    General: Skin is warm and dry.  Neurological:     Mental Status: He  is alert.     Assessment/Plan  History of colonic polyps/tubular adenomas Surveillance colonoscopy.  Hildred Laser, MD 08/07/2021, 9:12 AM

## 2021-08-07 NOTE — Anesthesia Postprocedure Evaluation (Signed)
Anesthesia Post Note  Patient: TERRALL BLEY  Procedure(s) Performed: COLONOSCOPY WITH PROPOFOL BIOPSY POLYPECTOMY  Patient location during evaluation: Phase II Anesthesia Type: General Level of consciousness: awake Pain management: pain level controlled Vital Signs Assessment: post-procedure vital signs reviewed and stable Respiratory status: spontaneous breathing and respiratory function stable Cardiovascular status: blood pressure returned to baseline and stable Postop Assessment: no headache and no apparent nausea or vomiting Anesthetic complications: no Comments: Late entry   No notable events documented.   Last Vitals:  Vitals:   08/07/21 0755 08/07/21 0947  BP: 118/89 (!) 96/58  Pulse: 73 66  Resp: 13 15  Temp: 36.7 C 36.4 C  SpO2: 96% 96%    Last Pain:  Vitals:   08/07/21 0949  TempSrc:   PainSc: 0-No pain                 Louann Sjogren

## 2021-08-08 ENCOUNTER — Encounter (INDEPENDENT_AMBULATORY_CARE_PROVIDER_SITE_OTHER): Payer: Self-pay | Admitting: *Deleted

## 2021-08-08 LAB — SURGICAL PATHOLOGY

## 2021-08-11 ENCOUNTER — Encounter (HOSPITAL_COMMUNITY): Payer: Self-pay | Admitting: Internal Medicine

## 2022-01-15 DIAGNOSIS — I10 Essential (primary) hypertension: Secondary | ICD-10-CM | POA: Diagnosis not present

## 2022-01-15 DIAGNOSIS — M109 Gout, unspecified: Secondary | ICD-10-CM | POA: Diagnosis not present

## 2022-04-03 DIAGNOSIS — A074 Cyclosporiasis: Secondary | ICD-10-CM | POA: Diagnosis not present

## 2022-04-03 DIAGNOSIS — R197 Diarrhea, unspecified: Secondary | ICD-10-CM | POA: Diagnosis not present

## 2022-04-29 DIAGNOSIS — M25552 Pain in left hip: Secondary | ICD-10-CM | POA: Diagnosis not present

## 2022-06-04 DIAGNOSIS — Z85828 Personal history of other malignant neoplasm of skin: Secondary | ICD-10-CM | POA: Diagnosis not present

## 2022-06-04 DIAGNOSIS — Z1283 Encounter for screening for malignant neoplasm of skin: Secondary | ICD-10-CM | POA: Diagnosis not present

## 2022-06-04 DIAGNOSIS — D485 Neoplasm of uncertain behavior of skin: Secondary | ICD-10-CM | POA: Diagnosis not present

## 2022-06-04 DIAGNOSIS — X32XXXD Exposure to sunlight, subsequent encounter: Secondary | ICD-10-CM | POA: Diagnosis not present

## 2022-06-04 DIAGNOSIS — D1801 Hemangioma of skin and subcutaneous tissue: Secondary | ICD-10-CM | POA: Diagnosis not present

## 2022-06-04 DIAGNOSIS — D225 Melanocytic nevi of trunk: Secondary | ICD-10-CM | POA: Diagnosis not present

## 2022-06-04 DIAGNOSIS — L570 Actinic keratosis: Secondary | ICD-10-CM | POA: Diagnosis not present

## 2022-06-04 DIAGNOSIS — Z08 Encounter for follow-up examination after completed treatment for malignant neoplasm: Secondary | ICD-10-CM | POA: Diagnosis not present

## 2022-06-04 DIAGNOSIS — C44729 Squamous cell carcinoma of skin of left lower limb, including hip: Secondary | ICD-10-CM | POA: Diagnosis not present

## 2022-06-23 DIAGNOSIS — M25552 Pain in left hip: Secondary | ICD-10-CM | POA: Diagnosis not present

## 2022-06-26 ENCOUNTER — Ambulatory Visit (HOSPITAL_COMMUNITY): Payer: Medicare Other | Attending: Family Medicine | Admitting: Physical Therapy

## 2022-06-26 DIAGNOSIS — M6281 Muscle weakness (generalized): Secondary | ICD-10-CM | POA: Diagnosis not present

## 2022-06-26 DIAGNOSIS — M25552 Pain in left hip: Secondary | ICD-10-CM | POA: Insufficient documentation

## 2022-06-26 NOTE — Therapy (Addendum)
OUTPATIENT PHYSICAL THERAPY LOWER EXTREMITY EVALUATION   Patient Name: Anthony English MRN: [id number] DOB:August 07, 1950, 71 y.o., male Today's Date: 06/26/2022  END OF SESSION:  PT End of Session - 06/26/22 1155     Visit Number 1    Number of Visits 8    Date for PT Re-Evaluation 07/26/22    Authorization Type [employer] medicare/BCBS    Progress Note Due on Visit 8    PT Start Time 1110    PT Stop Time 1155    PT Time Calculation (min) 45 min    Activity Tolerance Patient tolerated treatment well    Behavior During Therapy WFL for tasks assessed/performed             Past Medical History:  Diagnosis Date   Arthritis    Gout    Hypercholesteremia    Hypertension    Past Surgical History:  Procedure Laterality Date   BIOPSY  08/07/2021   Procedure: BIOPSY;  Surgeon: Malissa Hippo, MD;  [facility]: [facility];  Service: Endoscopy;;   COLONOSCOPY     COLONOSCOPY     COLONOSCOPY N/A 04/02/2016   Procedure: COLONOSCOPY;  Surgeon: Malissa Hippo, MD;  Location: [facility];  Service: Endoscopy;  Laterality: N/A;  1230   COLONOSCOPY WITH PROPOFOL N/A 08/07/2021   Procedure: COLONOSCOPY WITH PROPOFOL;  Surgeon: Malissa Hippo, MD;  [facility]: [facility];  Service: Endoscopy;  Laterality: N/A;  9:10   POLYPECTOMY  08/07/2021   Procedure: POLYPECTOMY;  Surgeon: Malissa Hippo, MD;  [facility]: [facility];  Service: Endoscopy;;   TOTAL HIP ARTHROPLASTY Left 05/29/2016   Procedure: LEFT TOTAL HIP ARTHROPLASTY ANTERIOR APPROACH;  Surgeon: Kathryne Hitch, MD;  Location: [facility];  Service: Orthopedics;  Laterality: Left;   VASECTOMY     Patient Active Problem List   Diagnosis Date Noted   Unilateral primary osteoarthritis, left hip 05/29/2016   Status post left hip replacement 05/29/2016    PCP: Carylon Perches  REFERRING PROVIDER: Otho Darner,  REFERRING DIAG:  PT eval/tx for lt ischial pain and tight hamstrings; H/O lt THA per Lura Em, MD     THERAPY DIAG:  Left buttock pain Difficulty in walking  Rationale for Evaluation and Treatment: Rehabilitation  ONSET DATE: off and on for five years.  He has been going to various MD for five years.   SUBJECTIVE:   SUBJECTIVE STATEMENT: Pt states that he has a pin point pain in the center of his left buttock.  It seems to hurt more when he is driving .  The pain is sharp and so severe that he wants to stop and get out of the car.    PERTINENT HISTORY: Lt THR- anterior approach 2019  PAIN:  Are you having pain? Yes: NPRS scale: 1/10, after driving will go to a 7/82  Pain location: Lt buttock  Pain description: sharp  Aggravating factors: driving  Relieving factors: meds   PRECAUTIONS: None  WEIGHT BEARING RESTRICTIONS: No  FALLS:  Has patient fallen in last 6 months? 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  PLOF: Independent  PATIENT GOALS: less pain   NEXT MD VISIT:   OBJECTIVE:   COGNITION: Overall cognitive status: Within functional limits for tasks assessed  MUSCLE LENGTH: Hamstrings: Right 145 deg; Left 130 deg  POSTURE: No Significant postural limitations  PALPATION: Tight gluteal    LOWER EXTREMITY MMT:  MMT Right eval Left eval  Hip flexion 5/5 3/5  Hip extension 3/5 3/5  Hip abduction 5/5 4/5  Hip adduction    Hip internal rotation    Hip external rotation    Knee flexion 5/5 5/5  Knee extension 5/5 5/5  Ankle dorsiflexion    Ankle plantarflexion     Ankle inversion    Ankle eversion     (Blank rows = not tested)  FUNCTIONAL TESTS:  30 seconds chair stand test-12  2 minute walk test: 530 ft noted antalgic gait  Single leg stance: Lt:  20    TODAY'S TREATMENT:                                                                                                                               DATE: Eval:  12/29: Knee to chest 3 x 30" Active hamstring stretch 3 x 30" Quadriped pirformis stretch 3 x 30"    PATIENT EDUCATION:  Education details: HEP Person educated: Patient Education method: Chief Technology Officer Education comprehension: verbalized understanding and returned demonstration  HOME EXERCISE PROGRAM: Access Code: [email or web address] URL: [email or web address] Date: 06/26/2022 Prepared by: Virgina Organ  Exercises - Supine Single Knee to Chest Stretch  - 2 x daily - 7 x weekly - 1 sets - 3 reps - 30" hold - Supine Hamstring Stretch  - 2 x daily - 7 x weekly - 1 sets - 3 reps - 30" hold - Quadruped Piriformis Stretch  - 2 x daily - 7 x weekly - 1 sets - 3 reps - 30" hold  ASSESSMENT:  CLINICAL IMPRESSION: Patient is a 71 y.o. Ma/e who was seen today for physical therapy evaluation and treatment for Lt ischial pain. Evaluation demonstrates decreased strength, impaired flexibility and increased pain.  Mr. Langlais will benefit from skilled PT to address these limitations and improve his functional ability.    OBJECTIVE IMPAIRMENTS: decreased activity tolerance, decreased balance, difficulty walking, decreased strength, increased fascial restrictions, impaired flexibility, and pain.   ACTIVITY LIMITATIONS: sitting, squatting, and locomotion level  PARTICIPATION LIMITATIONS: driving and community activity  REHAB POTENTIAL: Good  CLINICAL DECISION MAKING: Stable/uncomplicated  EVALUATION COMPLEXITY: Low   GOALS: Goals reviewed with patient? No  SHORT TERM GOALS: Target date: 07/10/22 PT to be I in Hep in order to be able to drive for 30 minutes without increased Pain Baseline: Goal status: INITIAL  2.  Pt to increase strength 1/2 grade to be able to go up and down his steps in a reciprocal manner without difficulty  Baseline:  Goal status:  INITIAL   LONG TERM GOALS: Target date: 07/24/22  PT to be I in advanced Hep in order to be able to drive for 60 minutes without increased Pain Baseline:  Goal status: INITIAL  2.  Pt to  increase strength 1 grade to be able to come up from a squatted position without difficulty  Baseline:  Goal status: INITIAL  3.  PT pain in Lt buttock to be no greater than a 2/10 throughout the day  Baseline:  Goal status: INITIAL   PLAN:  PT FREQUENCY: 2x/week  PT DURATION: 4 weeks  PLANNED INTERVENTIONS: Therapeutic exercises, Balance training, Gait training, Patient/Family education, Self Care, and Manual therapy  PLAN FOR NEXT SESSION: begin strengthening of gluteal and Lt hip flexor mm, continue with stretching.   Manual if needed for pain relief.  Virgina Organ, PT CLT [phone]  06/26/2022, 12:04 AM

## 2022-07-01 ENCOUNTER — Ambulatory Visit (HOSPITAL_COMMUNITY): Payer: Medicare PPO | Attending: Family Medicine | Admitting: Physical Therapy

## 2022-07-01 DIAGNOSIS — M6281 Muscle weakness (generalized): Secondary | ICD-10-CM | POA: Insufficient documentation

## 2022-07-01 DIAGNOSIS — M25552 Pain in left hip: Secondary | ICD-10-CM

## 2022-07-01 NOTE — Therapy (Signed)
OUTPATIENT PHYSICAL THERAPY TREATMENT   Patient Name: Anthony English MRN: [id number] DOB:September 15, 1950, 72 y.o., male Today's Date: 07/01/2022      END OF SESSION:   PT End of Session - 07/01/22 0947     Visit Number 2    Number of Visits 8    Date for PT Re-Evaluation 07/26/22    Authorization Type [employer] health medicare/BCBS    Progress Note Due on Visit 8    PT Start Time [phone]    PT Stop Time 1025    PT Time Calculation (min) 38 min    Activity Tolerance Patient tolerated treatment well    Behavior During Therapy [facility] for tasks assessed/performed              Past Medical History:  Diagnosis Date   Arthritis    Gout    Hypercholesteremia    Hypertension    Past Surgical History:  Procedure Laterality Date   BIOPSY  08/07/2021   Procedure: BIOPSY;  Surgeon: Rogene Houston, MD;  [facility];  Service: Endoscopy;;   COLONOSCOPY     COLONOSCOPY     COLONOSCOPY N/A 04/02/2016   Procedure: COLONOSCOPY;  Surgeon: Rogene Houston, MD;  [facility];  Service: Endoscopy;  Laterality: N/A;  1230   COLONOSCOPY WITH PROPOFOL N/A 08/07/2021   Procedure: COLONOSCOPY WITH PROPOFOL;  Surgeon: Rogene Houston, MD;  [facility];  Service: Endoscopy;  Laterality: N/A;  9:10   POLYPECTOMY  08/07/2021   Procedure: POLYPECTOMY;  Surgeon: Rogene Houston, MD;  [facility];  Service: Endoscopy;;   TOTAL HIP ARTHROPLASTY Left 05/29/2016   Procedure: LEFT TOTAL HIP ARTHROPLASTY ANTERIOR APPROACH;  Surgeon: Mcarthur Rossetti, MD;  Location: [facility];  Service: Orthopedics;  Laterality: Left;   VASECTOMY     Patient Active Problem List   Diagnosis Date Noted   Unilateral primary osteoarthritis, left hip 05/29/2016   Status post left hip replacement 05/29/2016    PCP: Asencion Noble  REFERRING PROVIDER: Gentry Fitz,  REFERRING DIAG:  PT eval/tx for lt ischial pain and tight hamstrings; H/O lt THA per Simonne Maffucci, MD    THERAPY  DIAG:  Left buttock pain Difficulty in walking  Rationale for Evaluation and Treatment: Rehabilitation  ONSET DATE: off and on for five years.  He has been going to various MD for five years.   SUBJECTIVE:   SUBJECTIVE STATEMENT: Pt reports compliance with HEP and states he can already tell a slight improvement from the exercises.  Evaluation: Pt states that he has a pin point pain in the center of his left buttock.  It seems to hurt more when he is driving .  The pain is sharp and so severe that he wants to stop and get out of the car.    PERTINENT HISTORY: Lt THR- anterior approach 2019  PAIN:  Are you having pain? Yes: NPRS scale: 1/10, after driving will go to a 9/10  Pain location: Lt buttock  Pain description: sharp  Aggravating factors: driving  Relieving factors: meds   PRECAUTIONS: None  WEIGHT BEARING RESTRICTIONS: No  FALLS:  Has patient fallen in last 6 months? 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  PLOF: Independent  PATIENT GOALS:  less pain   NEXT MD VISIT:   OBJECTIVE:   COGNITION: Overall cognitive status: Within functional limits for tasks assessed     MUSCLE LENGTH: Hamstrings: Right 145 deg; Left 130 deg  POSTURE: No Significant postural limitations  PALPATION: Tight gluteal    LOWER EXTREMITY MMT:  MMT Right eval Left eval  Hip flexion 5/5 3/5  Hip extension 3/5 3/5  Hip abduction 5/5 4/5  Hip adduction    Hip internal rotation    Hip external rotation    Knee flexion 5/5 5/5  Knee extension 5/5 5/5  Ankle dorsiflexion    Ankle plantarflexion     Ankle inversion    Ankle eversion     (Blank rows = not tested)  FUNCTIONAL TESTS:  30 seconds chair stand test-12  2 minute walk test: 530 ft  noted antalgic gait  Single leg stance: Lt:  20    TODAY'S TREATMENT:                                                                                                                              DATE:  07/01/22: Goal review Seated:  Piriformis stretch 2X30"  Long sitting Hamstring stretch 2X30" Supine:  bridge 10X  SLR 10X each Rt sidelying:  Lt hip abduction 10X Prone:  Hip extension bil 10X each Standing: Hip abduction and extension instruction (alt way)    Eval:  06/26/22: Knee to chest 3 x 30" Active hamstring stretch 3 x 30" Quadriped pirformis stretch 3 x 30"    PATIENT EDUCATION:  Education details: HEP Person educated: Patient Education method: Theatre stage manager Education comprehension: verbalized understanding and returned demonstration  HOME EXERCISE PROGRAM: Access Code: [email or web address] URL: [email or web address]  Date: 07/01/2022 Prepared by: Roseanne Reno Exercises - Seated Piriformis Stretch with Trunk Bend  - 2 x daily - 7 x weekly - 1 sets - 3 reps - 30 sec hold - Seated Hamstring Stretch with Chair  - 2 x daily - 7 x weekly - 1 sets - 3 reps - 30 sec hold - Supine Bridge  - 2 x daily - 7 x weekly - 1 sets - 10 reps - Supine Straight Leg Raises  - 2 x daily - 7 x weekly - 1 sets - 10 reps - Sidelying Hip Abduction  - 2 x daily - 7 x weekly - 1 sets - 10 reps - Prone Hip Extension  - 2 x daily - 7 x weekly - 1 sets - 10 reps  Date: 06/26/2022 (evaluation) Prepared by: Rayetta Humphrey Exercises - Supine Single Knee to Chest Stretch  - 2 x daily - 7 x weekly - 1 sets - 3 reps - 30" hold - Supine Hamstring Stretch  - 2 x daily - 7 x weekly - 1 sets - 3 reps - 30" hold - Quadruped Piriformis Stretch  - 2 x daily - 7 x weekly - 1 sets - 3 reps - 30" hold  ASSESSMENT:  CLINICAL IMPRESSION: Reviewed goals, HEP and POC moving forward . PT with c/o difficulty/discomfort completing quadruped piriformis stretch so instructed with seated with improved  results.  Pt also shown seated hamstring stretch as alternative way to complete.  Added LE strengthening this session on mat and also instructed in standing.  Updated HEP to include these. Pt will continue to benefit from skilled PT to address limitations and improve his functional ability.    OBJECTIVE IMPAIRMENTS: decreased activity tolerance, decreased balance, difficulty walking, decreased strength, increased fascial restrictions, impaired flexibility, and pain.   ACTIVITY LIMITATIONS: sitting, squatting, and locomotion level  PARTICIPATION LIMITATIONS: driving and community activity  REHAB POTENTIAL: Good  CLINICAL DECISION MAKING: Stable/uncomplicated  EVALUATION COMPLEXITY: Low   GOALS: Goals reviewed with patient? Yes  SHORT TERM GOALS: Target date: 07/10/22 PT to be I in Hep in order to be able to drive for 30 minutes without increased Pain Baseline: Goal status: IN PROGRESS  2.  Pt to increase strength 1/2 grade to be able to go up and down his steps in a reciprocal manner without difficulty  Baseline:  Goal status: IN PROGRESS   LONG TERM GOALS: Target date: 07/24/22  PT to be I in advanced Hep in order to be able to drive for 60 minutes without increased Pain Baseline:  Goal status: IN PROGRESS  2.  Pt to increase strength 1 grade to be able to come up from a squatted position without difficulty  Baseline:  Goal status: IN PROGRESS  3.  PT pain in Lt buttock to be no greater than a 2/10 throughout the day  Baseline:  Goal status: IN PROGRESS   PLAN:  PT FREQUENCY: 2x/week  PT DURATION: 4 weeks  PLANNED INTERVENTIONS: Therapeutic exercises, Balance training, Gait training, Patient/Family education, Self Care, and Manual therapy  PLAN FOR NEXT SESSION: Continue with strengthening of gluteal and Lt hip flexor mm, continue with stretching.   Manual if needed for pain relief.  Begin vectors and progress standing stabilization.   Teena Irani, PTA/CLT [place] Ph: [phone]   07/01/2022, 12:04 AM

## 2022-07-06 DIAGNOSIS — Z125 Encounter for screening for malignant neoplasm of prostate: Secondary | ICD-10-CM | POA: Diagnosis not present

## 2022-07-06 DIAGNOSIS — I10 Essential (primary) hypertension: Secondary | ICD-10-CM | POA: Diagnosis not present

## 2022-07-06 DIAGNOSIS — M109 Gout, unspecified: Secondary | ICD-10-CM | POA: Diagnosis not present

## 2022-07-06 DIAGNOSIS — E785 Hyperlipidemia, unspecified: Secondary | ICD-10-CM | POA: Diagnosis not present

## 2022-07-06 DIAGNOSIS — Z79899 Other long term (current) drug therapy: Secondary | ICD-10-CM | POA: Diagnosis not present

## 2022-07-07 ENCOUNTER — Encounter (HOSPITAL_COMMUNITY): Payer: Self-pay

## 2022-07-07 ENCOUNTER — Ambulatory Visit (HOSPITAL_COMMUNITY): Payer: Medicare PPO

## 2022-07-07 DIAGNOSIS — M6281 Muscle weakness (generalized): Secondary | ICD-10-CM | POA: Diagnosis not present

## 2022-07-07 DIAGNOSIS — M25552 Pain in left hip: Secondary | ICD-10-CM

## 2022-07-07 NOTE — Therapy (Signed)
OUTPATIENT PHYSICAL THERAPY TREATMENT   Patient Name: Anthony English MRN: [id number] DOB:[birth year], 72 y.o., male Today's Date: 07/07/2022      END OF SESSION:      Past Medical History:  Diagnosis Date   Arthritis    Gout    Hypercholesteremia    Hypertension    Past Surgical History:  Procedure Laterality Date   BIOPSY  08/07/2021   Procedure: BIOPSY;  Surgeon: Rogene Houston, MD;  [facility];  Service: Endoscopy;;   COLONOSCOPY     COLONOSCOPY     COLONOSCOPY N/A 04/02/2016   Procedure: COLONOSCOPY;  Surgeon: Rogene Houston, MD;  [facility];  Service: Endoscopy;  Laterality: N/A;  1230   COLONOSCOPY WITH PROPOFOL N/A 08/07/2021   Procedure: COLONOSCOPY WITH PROPOFOL;  Surgeon: Rogene Houston, MD;  [facility];  Service: Endoscopy;  Laterality: N/A;  9:10   POLYPECTOMY  08/07/2021   Procedure: POLYPECTOMY;  Surgeon: Rogene Houston, MD;  [facility];  Service: Endoscopy;;   TOTAL HIP ARTHROPLASTY Left 05/29/2016   Procedure: LEFT TOTAL HIP ARTHROPLASTY ANTERIOR APPROACH;  Surgeon: Mcarthur Rossetti, MD;  Location: [facility];  Service: Orthopedics;  Laterality: Left;   VASECTOMY     Patient Active Problem List   Diagnosis Date Noted   Unilateral primary osteoarthritis, left hip 05/29/2016   Status post left hip replacement 05/29/2016    PCP: Asencion Noble  REFERRING PROVIDER: Gentry Fitz,  REFERRING DIAG:  PT eval/tx for lt ischial pain and tight hamstrings; H/O lt THA per Simonne Maffucci, MD    THERAPY DIAG:  Left buttock pain Difficulty in walking  Rationale for Evaluation and Treatment: Rehabilitation  ONSET DATE: off and on for five years.  He has been going to various MD for five years.   SUBJECTIVE:   SUBJECTIVE STATEMENT: Pt reports discomfort and some stiffness BLE.  Reports some discomfort with abduction.  Reports compliance with HEP 2x daily, reports some cramping in arch of foot with mat  activities.    Evaluation: Pt states that he has a pin point pain in the center of his left buttock.  It seems to hurt more when he is driving .  The pain is sharp and so severe that he wants to stop and get out of the car.    PERTINENT HISTORY: Lt THR- anterior approach 2019  PAIN:  Are you having pain? Yes: NPRS scale: 1/10, after driving will go to a 9/10  Pain location: Lt buttock  Pain description: sharp  Aggravating factors: driving  Relieving factors: meds   PRECAUTIONS: None  WEIGHT BEARING RESTRICTIONS: No  FALLS:  Has patient fallen in last 6 months? 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  PLOF: Independent  PATIENT GOALS: less pain   NEXT MD VISIT:   OBJECTIVE:   COGNITION: Overall cognitive status: Within functional limits for tasks assessed     MUSCLE LENGTH: Hamstrings: Right 145 deg; Left 130 deg  POSTURE: No Significant postural limitations  PALPATION: Tight gluteal    LOWER EXTREMITY MMT:  MMT Right eval Left eval  Hip flexion 5/5 3/5  Hip extension 3/5 3/5  Hip abduction 5/5 4/5  Hip adduction    Hip internal rotation  Hip external rotation    Knee flexion 5/5 5/5  Knee extension 5/5 5/5  Ankle dorsiflexion    Ankle plantarflexion     Ankle inversion    Ankle eversion     (Blank rows = not tested)  FUNCTIONAL TESTS:  30 seconds chair stand test-12  2 minute walk test: 530 ft noted antalgic gait  Single leg stance: Lt:  20    TODAY'S TREATMENT:                                                                                                                              DATE:  07/07/22: Standing: 3D hip excursion (weight shifting, rotate, controlled STS then extend) Vector stance 3x 5" with 1  HHA Quadruped: hip extension 5x 3" Fire hydrant 5 x 3" Downward dog 20 reps walking  2x 20" static holds Supine: Bridge 10x 5" SKTC 2x 30" Piriformis figure 4 with towel assistance 2x 30"  07/01/22: Goal review Seated:  Piriformis stretch 2X30"  Long sitting Hamstring stretch 2X30" Supine:  bridge 10X  SLR 10X each Rt sidelying:  Lt hip abduction 10X Prone:  Hip extension bil 10X each Standing: Hip abduction and extension instruction (alt way)    Eval:  06/26/22: Knee to chest 3 x 30" Active hamstring stretch 3 x 30" Quadriped pirformis stretch 3 x 30"    PATIENT EDUCATION:  Education details: HEP Person educated: Patient Education method: Theatre stage manager Education comprehension: verbalized understanding and returned demonstration  HOME EXERCISE PROGRAM: Access Code: [email or web address] URL: [email or web address] 07/07/22: Supine piriformis stretch  Date: 07/01/2022 Prepared by: Roseanne Reno Exercises - Seated Piriformis Stretch with Trunk Bend  - 2 x daily - 7 x weekly - 1 sets - 3 reps - 30 sec hold - Seated Hamstring Stretch with Chair  - 2 x daily - 7 x weekly - 1 sets - 3 reps - 30 sec hold - Supine Bridge  - 2 x daily - 7 x weekly - 1 sets - 10 reps - Supine Straight Leg Raises  - 2 x daily - 7 x weekly - 1 sets - 10 reps - Sidelying Hip Abduction  - 2 x daily - 7 x weekly - 1 sets - 10 reps - Prone Hip Extension  - 2 x daily - 7 x weekly - 1 sets - 10 reps  Date: 06/26/2022 (evaluation) Prepared by: Rayetta Humphrey Exercises - Supine Single Knee to Chest Stretch  - 2 x daily - 7 x weekly - 1 sets - 3 reps - 30" hold - Supine Hamstring Stretch  - 2 x daily - 7 x weekly - 1 sets - 3 reps - 30" hold - Quadruped Piriformis Stretch  - 2 x daily - 7 x weekly - 1 sets - 3 reps - 30" hold  ASSESSMENT:  CLINICAL IMPRESSION: Session focus with gluteal/LE strengthening and mobility.  Began session with 3D hip excursion and progressed stability with  additional  vector stance and quadruped exercises for gluteal strengthening and continued with stretches.    OBJECTIVE IMPAIRMENTS: decreased activity tolerance, decreased balance, difficulty walking, decreased strength, increased fascial restrictions, impaired flexibility, and pain.   ACTIVITY LIMITATIONS: sitting, squatting, and locomotion level  PARTICIPATION LIMITATIONS: driving and community activity  REHAB POTENTIAL: Good  CLINICAL DECISION MAKING: Stable/uncomplicated  EVALUATION COMPLEXITY: Low   GOALS: Goals reviewed with patient? Yes  SHORT TERM GOALS: Target date: 07/10/22 PT to be I in Hep in order to be able to drive for 30 minutes without increased Pain Baseline: Goal status: IN PROGRESS  2.  Pt to increase strength 1/2 grade to be able to go up and down his steps in a reciprocal manner without difficulty  Baseline:  Goal status: IN PROGRESS   LONG TERM GOALS: Target date: 07/24/22  PT to be I in advanced Hep in order to be able to drive for 60 minutes without increased Pain Baseline:  Goal status: IN PROGRESS  2.  Pt to increase strength 1 grade to be able to come up from a squatted position without difficulty  Baseline:  Goal status: IN PROGRESS  3.  PT pain in Lt buttock to be no greater than a 2/10 throughout the day  Baseline:  Goal status: IN PROGRESS   PLAN:  PT FREQUENCY: 2x/week  PT DURATION: 4 weeks  PLANNED INTERVENTIONS: Therapeutic exercises, Balance training, Gait training, Patient/Family education, Self Care, and Manual therapy  PLAN FOR NEXT SESSION: Continue with strengthening of gluteal and Lt hip flexor mm, continue with stretching.   Manual if needed for pain relief.   Ihor Austin, LPTA/CLT; Delana Meyer [phone]  07/07/2022

## 2022-07-10 ENCOUNTER — Ambulatory Visit (HOSPITAL_COMMUNITY): Payer: Medicare PPO

## 2022-07-10 ENCOUNTER — Encounter (HOSPITAL_COMMUNITY): Payer: Self-pay

## 2022-07-10 DIAGNOSIS — M6281 Muscle weakness (generalized): Secondary | ICD-10-CM

## 2022-07-10 DIAGNOSIS — M25552 Pain in left hip: Secondary | ICD-10-CM | POA: Diagnosis not present

## 2022-07-10 NOTE — Therapy (Signed)
OUTPATIENT PHYSICAL THERAPY TREATMENT   Patient Name: Anthony English MRN: [id number] DOB:[birth year], 72 y.o., male Today's Date: 07/10/2022      END OF SESSION:   PT End of Session - 07/10/22 1347     Visit Number 4    Number of Visits 8    Date for PT Re-Evaluation 07/26/22    Authorization Type [employer] medicare/BCBS    Progress Note Due on Visit 8    PT Start Time 1347    PT Stop Time 1430    PT Time Calculation (min) 43 min    Activity Tolerance Patient tolerated treatment well    Behavior During Therapy WFL for tasks assessed/performed              Past Medical History:  Diagnosis Date   Arthritis    Gout    Hypercholesteremia    Hypertension    Past Surgical History:  Procedure Laterality Date   BIOPSY  08/07/2021   Procedure: BIOPSY;  Surgeon: Rogene Houston, MD;  [facility]: [facility];  Service: Endoscopy;;   COLONOSCOPY     COLONOSCOPY     COLONOSCOPY N/A 04/02/2016   Procedure: COLONOSCOPY;  Surgeon: Rogene Houston, MD;  Location: [facility];  Service: Endoscopy;  Laterality: N/A;  1230   COLONOSCOPY WITH PROPOFOL N/A 08/07/2021   Procedure: COLONOSCOPY WITH PROPOFOL;  Surgeon: Rogene Houston, MD;  [facility]: [facility];  Service: Endoscopy;  Laterality: N/A;  9:10   POLYPECTOMY  08/07/2021   Procedure: POLYPECTOMY;  Surgeon: Rogene Houston, MD;  Location: [facility];  Service: Endoscopy;;   TOTAL HIP ARTHROPLASTY Left 05/29/2016   Procedure: LEFT TOTAL HIP ARTHROPLASTY ANTERIOR APPROACH;  Surgeon: Mcarthur Rossetti, MD;  Location: [facility];  Service: Orthopedics;  Laterality: Left;   VASECTOMY     Patient Active Problem List   Diagnosis Date Noted   Unilateral primary osteoarthritis, left hip 05/29/2016   Status post left hip replacement 05/29/2016    PCP: Asencion Noble  REFERRING PROVIDER: Gentry Fitz,  REFERRING DIAG:  PT eval/tx for lt ischial pain and tight hamstrings; H/O lt THA per Simonne Maffucci, MD    THERAPY  DIAG:  Left buttock pain Difficulty in walking  Rationale for Evaluation and Treatment: Rehabilitation  ONSET DATE: off and on for five years.  He has been going to various MD for five years.   SUBJECTIVE:   SUBJECTIVE STATEMENT: Pt stated he is feeling good today, no reports of pain currently.  Reports his feet cramp during the quadruped stretches.    Evaluation: Pt states that he has a pin point pain in the center of his left buttock.  It seems to hurt more when he is driving .  The pain is sharp and so severe that he wants to stop and get out of the car.    PERTINENT HISTORY: Lt THR- anterior approach 2019  PAIN:  Are you having pain? Yes: NPRS scale: 1/10, after driving will go to a 9/10  Pain location: Lt buttock  Pain description: sharp  Aggravating factors: driving  Relieving factors: meds   PRECAUTIONS: None  WEIGHT BEARING RESTRICTIONS: No  FALLS:  Has patient fallen in last 6 months? 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  PLOF: Independent  PATIENT GOALS: less pain   NEXT MD VISIT:   OBJECTIVE:   COGNITION: Overall cognitive status: Within functional limits for tasks assessed     MUSCLE LENGTH: Hamstrings: Right 145 deg; Left 130 deg  POSTURE: No Significant postural limitations  PALPATION: Tight gluteal    LOWER EXTREMITY MMT:  MMT Right eval Left eval  Hip flexion 5/5 3/5  Hip extension 3/5 3/5  Hip abduction 5/5 4/5  Hip adduction    Hip internal rotation    Hip external rotation    Knee flexion 5/5 5/5  Knee extension 5/5 5/5  Ankle dorsiflexion    Ankle plantarflexion     Ankle inversion    Ankle eversion     (Blank rows = not tested)  FUNCTIONAL TESTS:  30 seconds chair stand test-12  2  minute walk test: 530 ft noted antalgic gait  Single leg stance: Lt:  20    TODAY'S TREATMENT:                                                                                                                              DATE:  07/10/22: Standing: 3D hip excursion (weight shifting, rotate, squat front of chair then extend) Vector stance 3x 5" with 1 HHA Hamstring stretch on 12in step 3x 30" Quadruped:  UE/LE 10x 5" Fire hydrant 5 x 3" Downward dog 20 reps walking  2x 20" static holds Supine: Piriformis figure 4 with towel assistance 2x 30" Hamstring stretch 3x 30"  07/07/22: Standing: 3D hip excursion (weight shifting, rotate, controlled STS then extend) Vector stance 3x 5" with 1 HHA Quadruped: hip extension 5x 3" Fire hydrant 5 x 3" Downward dog 20 reps walking  2x 20" static holds Supine: Bridge 10x 5" SKTC 2x 30" Piriformis figure 4 with towel assistance 2x 30"  07/01/22: Goal review Seated:  Piriformis stretch 2X30"  Long sitting Hamstring stretch 2X30" Supine:  bridge 10X  SLR 10X each Rt sidelying:  Lt hip abduction 10X Prone:  Hip extension bil 10X each Standing: Hip abduction and extension instruction (alt way)    Eval:  06/26/22: Knee to chest 3 x 30" Active hamstring stretch 3 x 30" Quadriped pirformis stretch 3 x 30"    PATIENT EDUCATION:  Education details: HEP Person educated: Patient Education method: Theatre stage manager Education comprehension: verbalized understanding and returned demonstration  HOME EXERCISE PROGRAM: Access Code: [email or web address] URL: [email or web address] 07/07/22: Supine piriformis stretch  Date: 07/01/2022 Prepared by: Roseanne Reno Exercises - Seated Piriformis Stretch with Trunk Bend  - 2 x daily - 7 x weekly - 1 sets - 3 reps - 30 sec hold - Seated Hamstring Stretch with Chair  - 2 x daily - 7 x weekly - 1 sets - 3 reps - 30 sec hold - Supine Bridge  - 2 x daily - 7 x weekly - 1 sets - 10 reps - Supine  Straight Leg Raises  - 2 x  daily - 7 x weekly - 1 sets - 10 reps - Sidelying Hip Abduction  - 2 x daily - 7 x weekly - 1 sets - 10 reps - Prone Hip Extension  - 2 x daily - 7 x weekly - 1 sets - 10 reps  Date: 06/26/2022 (evaluation) Prepared by: Rayetta Humphrey Exercises - Supine Single Knee to Chest Stretch  - 2 x daily - 7 x weekly - 1 sets - 3 reps - 30" hold - Supine Hamstring Stretch  - 2 x daily - 7 x weekly - 1 sets - 3 reps - 30" hold - Quadruped Piriformis Stretch  - 2 x daily - 7 x weekly - 1 sets - 3 reps - 30" hold  ASSESSMENT:  CLINICAL IMPRESSION: Continued session focus with gluteal strengthening and mobility.  Added squats with verbal and visual feedback for equal weight bearing.  Pt shown alternative hamstring stretch in standing.  Pt stated he plans to take a 9hr trip to [place] next Wednesday, advised to stop and stand/walk along the way.    OBJECTIVE IMPAIRMENTS: decreased activity tolerance, decreased balance, difficulty walking, decreased strength, increased fascial restrictions, impaired flexibility, and pain.   ACTIVITY LIMITATIONS: sitting, squatting, and locomotion level  PARTICIPATION LIMITATIONS: driving and community activity  REHAB POTENTIAL: Good  CLINICAL DECISION MAKING: Stable/uncomplicated  EVALUATION COMPLEXITY: Low   GOALS: Goals reviewed with patient? Yes  SHORT TERM GOALS: Target date: 07/10/22 PT to be I in Hep in order to be able to drive for 30 minutes without increased Pain Baseline: Goal status: IN PROGRESS  2.  Pt to increase strength 1/2 grade to be able to go up and down his steps in a reciprocal manner without difficulty  Baseline:  Goal status: IN PROGRESS   LONG TERM GOALS: Target date: 07/24/22  PT to be I in advanced Hep in order to be able to drive for 60 minutes without increased Pain Baseline:  Goal status: IN PROGRESS  2.  Pt to increase strength 1 grade to be able to come up from a squatted position without  difficulty  Baseline:  Goal status: IN PROGRESS  3.  PT pain in Lt buttock to be no greater than a 2/10 throughout the day  Baseline:  Goal status: IN PROGRESS   PLAN:  PT FREQUENCY: 2x/week  PT DURATION: 4 weeks  PLANNED INTERVENTIONS: Therapeutic exercises, Balance training, Gait training, Patient/Family education, Self Care, and Manual therapy  PLAN FOR NEXT SESSION: Continue with strengthening of gluteal and Lt hip flexor mm, continue with stretching.   Manual if needed for pain relief.   Ihor Austin, LPTA/CLT; Delana Meyer [phone]  07/10/2022

## 2022-07-13 ENCOUNTER — Ambulatory Visit (HOSPITAL_COMMUNITY): Payer: Medicare PPO | Admitting: Physical Therapy

## 2022-07-13 DIAGNOSIS — M25552 Pain in left hip: Secondary | ICD-10-CM

## 2022-07-13 DIAGNOSIS — M6281 Muscle weakness (generalized): Secondary | ICD-10-CM

## 2022-07-13 NOTE — Therapy (Signed)
OUTPATIENT PHYSICAL THERAPY TREATMENT   Patient Name: Anthony English MRN: [id number] DOB:1951-06-08, 72 y.o., male Today's Date: 07/13/2022      END OF SESSION:   PT End of Session - 07/13/22 1527     Visit Number 5    Number of Visits 8    Date for PT Re-Evaluation 07/26/22    Authorization Type [employer] medicare/BCBS    Progress Note Due on Visit 8    PT Start Time 1520    PT Stop Time 1600    PT Time Calculation (min) 40 min    Activity Tolerance Patient tolerated treatment well    Behavior During Therapy [facility] for tasks assessed/performed               Past Medical History:  Diagnosis Date   Arthritis    Gout    Hypercholesteremia    Hypertension    Past Surgical History:  Procedure Laterality Date   BIOPSY  08/07/2021   Procedure: BIOPSY;  Surgeon: Rogene Houston, MD;  [facility]: [facility];  Service: Endoscopy;;   COLONOSCOPY     COLONOSCOPY     COLONOSCOPY N/A 04/02/2016   Procedure: COLONOSCOPY;  Surgeon: Rogene Houston, MD;  [facility];  Service: Endoscopy;  Laterality: N/A;  1230   COLONOSCOPY WITH PROPOFOL N/A 08/07/2021   Procedure: COLONOSCOPY WITH PROPOFOL;  Surgeon: Rogene Houston, MD;  [facility]: [facility];  Service: Endoscopy;  Laterality: N/A;  9:10   POLYPECTOMY  08/07/2021   Procedure: POLYPECTOMY;  Surgeon: Rogene Houston, MD;  [facility];  Service: Endoscopy;;   TOTAL HIP ARTHROPLASTY Left 05/29/2016   Procedure: LEFT TOTAL HIP ARTHROPLASTY ANTERIOR APPROACH;  Surgeon: Mcarthur Rossetti, MD;  Location: [facility];  Service: Orthopedics;  Laterality: Left;   VASECTOMY     Patient Active Problem List   Diagnosis Date Noted   Unilateral primary osteoarthritis, left hip 05/29/2016   Status post left hip replacement 05/29/2016    PCP: Asencion Noble  REFERRING PROVIDER: Gentry Fitz,  REFERRING DIAG:  PT eval/tx for lt ischial pain and tight hamstrings; H/O lt THA per Simonne Maffucci, MD    THERAPY  DIAG:  Left buttock pain Difficulty in walking  Rationale for Evaluation and Treatment: Rehabilitation  ONSET DATE: off and on for five years.  He has been going to various MD for five years.   SUBJECTIVE:   SUBJECTIVE STATEMENT: Pt states doing well overall.  Reports he quit doing the quadruped piriformis stretch due to bothering his feet but can do the seated one.   Evaluation: Pt states that he has a pin point pain in the center of his left buttock.  It seems to hurt more when he is driving .  The pain is sharp and so severe that he wants to stop and get out of the car.    PERTINENT HISTORY: Lt THR- anterior approach 2019  PAIN:  Are you having pain? Yes: NPRS scale: 1/10, after driving will go to a 9/10  Pain location: Lt buttock  Pain description: sharp  Aggravating factors: driving  Relieving factors: meds   PRECAUTIONS: None  WEIGHT BEARING RESTRICTIONS: No  FALLS:  Has patient fallen in last 6 months? No  LIVING ENVIRONMENT: Lives with: lives with their family Lives in: House/apartment Stairs: Yes: Internal: 10 steps; on right going up and External: 3 steps; on right going up Has following equipment at home: None  OCCUPATION: retired but  delivers buses cross country.  PLOF: Independent  PATIENT GOALS: less pain   NEXT MD VISIT:   OBJECTIVE:   COGNITION: Overall cognitive status: Within functional limits for tasks assessed     MUSCLE LENGTH: Hamstrings: Right 145 deg; Left 130 deg  POSTURE: No Significant postural limitations  PALPATION: Tight gluteal    LOWER EXTREMITY MMT:  MMT Right eval Left eval  Hip flexion 5/5 3/5  Hip extension 3/5 3/5  Hip abduction 5/5 4/5  Hip adduction    Hip internal rotation    Hip external rotation    Knee flexion 5/5 5/5  Knee extension 5/5 5/5  Ankle dorsiflexion    Ankle plantarflexion     Ankle inversion    Ankle eversion     (Blank rows = not tested)  FUNCTIONAL TESTS:  30 seconds chair stand  test-12  2 minute walk test: 530 ft noted antalgic gait  Single leg stance: Lt:  20    TODAY'S TREATMENT:                                                                                                                              DATE:  07/13/22: Standing: 3D hip excursion 10X each Vector stance 10x 5" with 1 HHA Hamstring stretch on 12in step 3x 30" Quadruped: UE/LE 10x 5" Fire hydrant 10 x 3" Seated:  midback stretch using stool 5X20" Seated:  long sitting hamstring stretch 3X30" each LE  Piriformis stretch 3X30"  07/10/22: Standing: 3D hip excursion (weight shifting, rotate, squat front of chair then extend) Vector stance 3x 5" with 1 HHA Hamstring stretch on 12in step 3x 30" Quadruped:  UE/LE 10x 5" Fire hydrant 5 x 3" Downward dog 20 reps walking  2x 20" static holds Supine: Piriformis figure 4 with towel assistance 2x 30" Hamstring stretch 3x 30"  07/07/22: Standing: 3D hip excursion (weight shifting, rotate, controlled STS then extend) Vector stance 3x 5" with 1 HHA Quadruped: hip extension 5x 3" Fire hydrant 5 x 3" Downward dog 20 reps walking  2x 20" static holds Supine: Bridge 10x 5" SKTC 2x 30" Piriformis figure 4 with towel assistance 2x 30"  07/01/22: Goal review Seated:  Piriformis stretch 2X30"  Long sitting Hamstring stretch 2X30" Supine:  bridge 10X  SLR 10X each Rt sidelying:  Lt hip abduction 10X Prone:  Hip extension bil 10X each Standing: Hip abduction and extension instruction (alt way)    Eval:  06/26/22: Knee to chest 3 x 30" Active hamstring stretch 3 x 30" Quadriped pirformis stretch 3 x 30"    PATIENT EDUCATION:  Education details: HEP Person educated: Patient Education method: Theatre stage manager Education comprehension: verbalized understanding and returned demonstration  HOME EXERCISE PROGRAM: Access Code: [email or web address] URL: [email or web address] 07/07/22: Supine piriformis stretch  Date:  07/01/2022 Prepared by: Roseanne Reno Exercises - Seated Piriformis Stretch with Trunk Bend  - 2 x daily - 7 x weekly - 1 sets - 3 reps -  30 sec hold - Seated Hamstring Stretch with Chair  - 2 x daily - 7 x weekly - 1 sets - 3 reps - 30 sec hold - Supine Bridge  - 2 x daily - 7 x weekly - 1 sets - 10 reps - Supine Straight Leg Raises  - 2 x daily - 7 x weekly - 1 sets - 10 reps - Sidelying Hip Abduction  - 2 x daily - 7 x weekly - 1 sets - 10 reps - Prone Hip Extension  - 2 x daily - 7 x weekly - 1 sets - 10 reps  Date: 06/26/2022 (evaluation) Prepared by: Rayetta Humphrey Exercises - Supine Single Knee to Chest Stretch  - 2 x daily - 7 x weekly - 1 sets - 3 reps - 30" hold - Supine Hamstring Stretch  - 2 x daily - 7 x weekly - 1 sets - 3 reps - 30" hold - Quadruped Piriformis Stretch  - 2 x daily - 7 x weekly - 1 sets - 3 reps - 30" hold  ASSESSMENT:  CLINICAL IMPRESSION: Continued with focus on improving gluteal strength and core stability.  Pt requires cues for generalized form and hold times with stretches.  Pt encouraged to continue seated piriformis stretch as quadruped initiates arches causing cramps. Pt stated he is going out of town for a couple weeks on Wednesday but will do his HEP. Pt will conitnue to benefit from skilled therapy to reduce symptoms and pain.      OBJECTIVE IMPAIRMENTS: decreased activity tolerance, decreased balance, difficulty walking, decreased strength, increased fascial restrictions, impaired flexibility, and pain.   ACTIVITY LIMITATIONS: sitting, squatting, and locomotion level  PARTICIPATION LIMITATIONS: driving and community activity  REHAB POTENTIAL: Good  CLINICAL DECISION MAKING: Stable/uncomplicated  EVALUATION COMPLEXITY: Low   GOALS: Goals reviewed with patient? Yes  SHORT TERM GOALS: Target date: 07/10/22 PT to be I in Hep in order to be able to drive for 30 minutes without increased Pain Baseline: Goal status: IN PROGRESS  2.  Pt to  increase strength 1/2 grade to be able to go up and down his steps in a reciprocal manner without difficulty  Baseline:  Goal status: IN PROGRESS   LONG TERM GOALS: Target date: 07/24/22  PT to be I in advanced Hep in order to be able to drive for 60 minutes without increased Pain Baseline:  Goal status: IN PROGRESS  2.  Pt to increase strength 1 grade to be able to come up from a squatted position without difficulty  Baseline:  Goal status: IN PROGRESS  3.  PT pain in Lt buttock to be no greater than a 2/10 throughout the day  Baseline:  Goal status: IN PROGRESS   PLAN:  PT FREQUENCY: 2x/week  PT DURATION: 4 weeks  PLANNED INTERVENTIONS: Therapeutic exercises, Balance training, Gait training, Patient/Family education, Self Care, and Manual therapy  PLAN FOR NEXT SESSION: Continue with strengthening of gluteal and Lt hip flexor mm, continue with stretching.   Manual if needed for pain relief.   Teena Irani, PTA/CLT [place] Ph: [phone]  07/13/2022

## 2022-07-14 DIAGNOSIS — E785 Hyperlipidemia, unspecified: Secondary | ICD-10-CM | POA: Diagnosis not present

## 2022-07-14 DIAGNOSIS — Z0000 Encounter for general adult medical examination without abnormal findings: Secondary | ICD-10-CM | POA: Diagnosis not present

## 2022-07-14 DIAGNOSIS — I10 Essential (primary) hypertension: Secondary | ICD-10-CM | POA: Diagnosis not present

## 2022-07-14 DIAGNOSIS — M109 Gout, unspecified: Secondary | ICD-10-CM | POA: Diagnosis not present

## 2022-07-14 DIAGNOSIS — Z23 Encounter for immunization: Secondary | ICD-10-CM | POA: Diagnosis not present

## 2022-07-28 ENCOUNTER — Ambulatory Visit (HOSPITAL_COMMUNITY): Payer: Medicare PPO | Admitting: Physical Therapy

## 2022-07-28 DIAGNOSIS — M6281 Muscle weakness (generalized): Secondary | ICD-10-CM | POA: Diagnosis not present

## 2022-07-28 DIAGNOSIS — M25552 Pain in left hip: Secondary | ICD-10-CM

## 2022-07-28 NOTE — Therapy (Signed)
OUTPATIENT PHYSICAL THERAPY TREATMENT Progress Note Reporting Period 06/26/2022 to 07/28/2022  See note below for Objective Data and Assessment of Progress/Goals.      Patient Name: Anthony English MRN: [id number] DOB:Jul 20, 1950, 72 y.o., male Today's Date: 07/28/2022      END OF SESSION:   PT End of Session - 07/28/22 1124     Visit Number 6    Number of Visits 8    Date for PT Re-Evaluation 08/20/22    Authorization Type [employer] medicare/BCBS    Progress Note Due on Visit 8    PT Start Time 1122    PT Stop Time 1200    PT Time Calculation (min) 38 min    Activity Tolerance Patient tolerated treatment well    Behavior During Therapy WFL for tasks assessed/performed               Past Medical History:  Diagnosis Date   Arthritis    Gout    Hypercholesteremia    Hypertension    Past Surgical History:  Procedure Laterality Date   BIOPSY  08/07/2021   Procedure: BIOPSY;  Surgeon: Rogene Houston, MD;  Location: [facility];  Service: Endoscopy;;   COLONOSCOPY     COLONOSCOPY     COLONOSCOPY N/A 04/02/2016   Procedure: COLONOSCOPY;  Surgeon: Rogene Houston, MD;  Location: [facility];  Service: Endoscopy;  Laterality: N/A;  1230   COLONOSCOPY WITH PROPOFOL N/A 08/07/2021   Procedure: COLONOSCOPY WITH PROPOFOL;  Surgeon: Rogene Houston, MD;  Location: [facility];  Service: Endoscopy;  Laterality: N/A;  9:10   POLYPECTOMY  08/07/2021   Procedure: POLYPECTOMY;  Surgeon: Rogene Houston, MD;  Location: [facility];  Service: Endoscopy;;   TOTAL HIP ARTHROPLASTY Left 05/29/2016   Procedure: LEFT TOTAL HIP ARTHROPLASTY ANTERIOR APPROACH;  Surgeon: Mcarthur Rossetti, MD;  Location: [facility];  Service: Orthopedics;  Laterality: Left;   VASECTOMY     Patient Active Problem List   Diagnosis Date Noted   Unilateral primary osteoarthritis, left hip 05/29/2016   Status post left hip replacement 05/29/2016    PCP: Asencion Noble  REFERRING PROVIDER: Gentry Fitz,  REFERRING DIAG:  PT eval/tx for lt ischial pain and tight hamstrings; H/O lt THA per Simonne Maffucci, MD    THERAPY DIAG:  Left buttock pain Difficulty in walking  Rationale for Evaluation and Treatment: Rehabilitation  ONSET DATE: off and on for five years.  He has been going to various MD for five years.   SUBJECTIVE:   SUBJECTIVE STATEMENT: Pt returns following trip to [place] for 8 days.  States he was feeling pain by the time he got to [place].  Reports 30% improvement.  Reports he walked a lot on vacation but did not do the exercises.    Evaluation: Pt states that he has a pin point pain in the center of his left buttock.  It seems to hurt more when he is driving .  The pain is sharp and so severe that he wants to stop and get out of the car.    PERTINENT HISTORY: Lt THR- anterior approach 2019  PAIN:  Are you having pain? Yes: NPRS scale: 1/10, after driving will go to a 9/10  Pain location: Lt buttock  Pain description: sharp  Aggravating factors: driving  Relieving factors: meds   PRECAUTIONS: None  WEIGHT BEARING RESTRICTIONS: No  FALLS:  Has patient fallen in last 6 months? No  LIVING  ENVIRONMENT: Lives with: lives with their family Lives in: House/apartment Stairs: Yes: Internal: 10 steps; on right going up and External: 3 steps; on right going up Has following equipment at home: None  OCCUPATION: retired but delivers buses cross country.  PLOF: Independent  PATIENT GOALS: less pain   NEXT MD VISIT:  no return appointment scheduled  OBJECTIVE:   COGNITION: Overall cognitive status: Within functional limits for tasks assessed     MUSCLE LENGTH: Hamstrings: Right 145 deg; Left 130 deg  POSTURE: No Significant postural limitations  PALPATION: Tight gluteal    LOWER EXTREMITY MMT:  MMT Right eval Left eval Right 07/28/22 Left  07/28/22  Hip flexion 5/5 3/5  4-  Hip extension 3/5 3/5 4 3+  Hip abduction 5/5 4/5  4+  Hip  adduction      Hip internal rotation      Hip external rotation      Knee flexion 5/5 5/5    Knee extension 5/5 5/5    Ankle dorsiflexion      Ankle plantarflexion       Ankle inversion      Ankle eversion       (Blank rows = not tested)  FUNCTIONAL TESTS:  30 seconds chair stand test-12  2 minute walk test: 530 ft noted antalgic gait  Single leg stance: Lt:  20    TODAY'S TREATMENT:                                                                                                                              DATE:  07/28/22 Progress note/functional testing MMT Functional measures: 30 seconds chair stand test  14 (was 12 at evaluation) 2 minute walk test: NT but now able to walk without antalgic gait (was 530 ft noted antalgic gait)  Single leg stance:  Rt:14" Lt: 30"  Lt: (at evaluation Lt: 20) Standing:  hamstring stretch Lt 3X30"  Vectors 10X5" each LE with 1 UE assist Seated:  piriformis stretch 3X30"  07/13/22: Standing: 3D hip excursion 10X each Vector stance 10x 5" with 1 HHA Hamstring stretch on 12in step 3x 30" Quadruped: UE/LE 10x 5" Fire hydrant 10 x 3" Seated:  midback stretch using stool 5X20" Seated:  long sitting hamstring stretch 3X30" each LE  Piriformis stretch 3X30"  07/10/22: Standing: 3D hip excursion (weight shifting, rotate, squat front of chair then extend) Vector stance 3x 5" with 1 HHA Hamstring stretch on 12in step 3x 30" Quadruped:  UE/LE 10x 5" Fire hydrant 5 x 3" Downward dog 20 reps walking  2x 20" static holds Supine: Piriformis figure 4 with towel assistance 2x 30" Hamstring stretch 3x 30"  07/07/22: Standing: 3D hip excursion (weight shifting, rotate, controlled STS then extend) Vector stance 3x 5" with 1 HHA Quadruped: hip extension 5x 3" Fire hydrant 5 x 3" Downward dog 20 reps walking  2x 20" static holds Supine: Bridge 10x 5" SKTC 2x 30" Piriformis figure 4  with towel assistance 2x 30"  07/01/22: Goal review Seated:   Piriformis stretch 2X30"  Long sitting Hamstring stretch 2X30" Supine:  bridge 10X  SLR 10X each Rt sidelying:  Lt hip abduction 10X Prone:  Hip extension bil 10X each Standing: Hip abduction and extension instruction (alt way)    Eval:  06/26/22: Knee to chest 3 x 30" Active hamstring stretch 3 x 30" Quadriped pirformis stretch 3 x 30"    PATIENT EDUCATION:  Education details: HEP Person educated: Patient Education method: Theatre stage manager Education comprehension: verbalized understanding and returned demonstration  HOME EXERCISE PROGRAM: Access Code: [email or web address] URL: [email or web address] 07/07/22: Supine piriformis stretch  Date: 07/01/2022 Prepared by: Roseanne Reno Exercises - Seated Piriformis Stretch with Trunk Bend  - 2 x daily - 7 x weekly - 1 sets - 3 reps - 30 sec hold - Seated Hamstring Stretch with Chair  - 2 x daily - 7 x weekly - 1 sets - 3 reps - 30 sec hold - Supine Bridge  - 2 x daily - 7 x weekly - 1 sets - 10 reps - Supine Straight Leg Raises  - 2 x daily - 7 x weekly - 1 sets - 10 reps - Sidelying Hip Abduction  - 2 x daily - 7 x weekly - 1 sets - 10 reps - Prone Hip Extension  - 2 x daily - 7 x weekly - 1 sets - 10 reps  Date: 06/26/2022 (evaluation) Prepared by: Rayetta Humphrey Exercises - Supine Single Knee to Chest Stretch  - 2 x daily - 7 x weekly - 1 sets - 3 reps - 30" hold - Supine Hamstring Stretch  - 2 x daily - 7 x weekly - 1 sets - 3 reps - 30" hold - Quadruped Piriformis Stretch  - 2 x daily - 7 x weekly - 1 sets - 3 reps - 30" hold  ASSESSMENT:  CLINICAL IMPRESSION: Progress note completed this session.  PT with improvement in strength and overall functioning.  Pt continues to have pain when driving that occurs after approx 20 minutes and needs to re-medicate every hour following to prevent pain when driving.  Pt has not met any goals, however has improved.  Pt does report improvement overall and feels continued therapy  will further improve his condition.  Pt will continue to benefit from skilled therapy in order to reduce symptoms and pain.      OBJECTIVE IMPAIRMENTS: decreased activity tolerance, decreased balance, difficulty walking, decreased strength, increased fascial restrictions, impaired flexibility, and pain.   ACTIVITY LIMITATIONS: sitting, squatting, and locomotion level  PARTICIPATION LIMITATIONS: driving and community activity  REHAB POTENTIAL: Good  CLINICAL DECISION MAKING: Stable/uncomplicated  EVALUATION COMPLEXITY: Low   GOALS: Goals reviewed with patient? Yes  SHORT TERM GOALS: Target date: 07/10/22 PT to be I in Hep in order to be able to drive for 30 minutes without increased Pain Baseline: Goal status: IN PROGRESS  2.  Pt to increase strength 1/2 grade to be able to go up and down his steps in a reciprocal manner without difficulty  Baseline:  Goal status: IN PROGRESS; partly met with strength goal   LONG TERM GOALS: Target date: 07/24/22  PT to be I in advanced Hep in order to be able to drive for 60 minutes without increased Pain Baseline:  Goal status: IN PROGRESS  2.  Pt to increase strength 1 grade to be able to come up from a squatted position without difficulty  Baseline:  Goal  status: IN PROGRESS  3.  PT pain in Lt buttock to be no greater than a 2/10 throughout the day  Baseline:  Goal status: IN PROGRESS   PLAN:  PT FREQUENCY: 2x/week  PT DURATION: 4 weeks  PLANNED INTERVENTIONS: Therapeutic exercises, Balance training, Gait training, Patient/Family education, Self Care, and Manual therapy  PLAN FOR NEXT SESSION: Continue with strengthening of gluteal and Lt hip flexor mm, continue with stretching.   Manual if needed for pain relief.   [place], [id number] [facility] Ph: [phone]  07/28/2022

## 2022-07-30 ENCOUNTER — Other Ambulatory Visit: Payer: Self-pay

## 2022-07-30 ENCOUNTER — Ambulatory Visit (HOSPITAL_COMMUNITY): Payer: Medicare PPO | Attending: Family Medicine | Admitting: Physical Therapy

## 2022-07-30 DIAGNOSIS — Z85828 Personal history of other malignant neoplasm of skin: Secondary | ICD-10-CM | POA: Diagnosis not present

## 2022-07-30 DIAGNOSIS — M6281 Muscle weakness (generalized): Secondary | ICD-10-CM

## 2022-07-30 DIAGNOSIS — M25552 Pain in left hip: Secondary | ICD-10-CM | POA: Diagnosis not present

## 2022-07-30 DIAGNOSIS — Z08 Encounter for follow-up examination after completed treatment for malignant neoplasm: Secondary | ICD-10-CM | POA: Diagnosis not present

## 2022-07-30 NOTE — Therapy (Signed)
OUTPATIENT PHYSICAL THERAPY TREATMENT Progress Note    Patient Name: MARTINO TOMPSON MRN: [id number] DOB:12-04-1950, 72 y.o., male Today's Date: 07/30/2022      END OF SESSION:   PT End of Session - 07/30/22 1330     Visit Number 7    Number of Visits 8    Date for PT Re-Evaluation 08/20/22    Authorization Type [employer] health medicare/BCBS    Progress Note Due on Visit 8    PT Start Time 1345    PT Stop Time 1428    PT Time Calculation (min) 43 min    Activity Tolerance Patient tolerated treatment well    Behavior During Therapy WFL for tasks assessed/performed               Past Medical History:  Diagnosis Date   Arthritis    Gout    Hypercholesteremia    Hypertension    Past Surgical History:  Procedure Laterality Date   BIOPSY  08/07/2021   Procedure: BIOPSY;  Surgeon: Rogene Houston, MD;  [facility]: [facility];  Service: Endoscopy;;   COLONOSCOPY     COLONOSCOPY     COLONOSCOPY N/A 04/02/2016   Procedure: COLONOSCOPY;  Surgeon: Rogene Houston, MD;  [facility];  Service: Endoscopy;  Laterality: N/A;  1230   COLONOSCOPY WITH PROPOFOL N/A 08/07/2021   Procedure: COLONOSCOPY WITH PROPOFOL;  Surgeon: Rogene Houston, MD;  [facility];  Service: Endoscopy;  Laterality: N/A;  9:10   POLYPECTOMY  08/07/2021   Procedure: POLYPECTOMY;  Surgeon: Rogene Houston, MD;  [facility];  Service: Endoscopy;;   TOTAL HIP ARTHROPLASTY Left 05/29/2016   Procedure: LEFT TOTAL HIP ARTHROPLASTY ANTERIOR APPROACH;  Surgeon: Mcarthur Rossetti, MD;  Location: [facility];  Service: Orthopedics;  Laterality: Left;   VASECTOMY     Patient Active Problem List   Diagnosis Date Noted   Unilateral primary osteoarthritis, left hip 05/29/2016   Status post left hip replacement 05/29/2016    PCP: Asencion Noble  REFERRING PROVIDER: Gentry Fitz,  REFERRING DIAG:  PT eval/tx for lt ischial pain and tight hamstrings; H/O lt THA per Simonne Maffucci, MD     THERAPY DIAG:  Left buttock pain Difficulty in walking  Rationale for Evaluation and Treatment: Rehabilitation  ONSET DATE: off and on for five years.  He has been going to various MD for five years.     SUBJECTIVE STATEMENT:  Pt states he has been a little better doing his exercises but still not consistent.  PERTINENT HISTORY: Lt THR- anterior approach 2019  PAIN:  Are you having pain? Yes: NPRS scale: 0/10, after driving will go to a 6/10  Pain location: Lt buttock  Pain description: sharp  Aggravating factors: driving  Relieving factors: meds   PRECAUTIONS: None  WEIGHT BEARING RESTRICTIONS: No  FALLS:  Has patient fallen in last 6 months? 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  PLOF: Independent  PATIENT GOALS: less pain   NEXT MD VISIT:  no return appointment scheduled  OBJECTIVE:   COGNITION: Overall cognitive status: Within functional limits for tasks assessed     MUSCLE LENGTH: Hamstrings: Right 145 deg; Left 130 deg  POSTURE: No Significant postural limitations  PALPATION: Tight  gluteal    LOWER EXTREMITY MMT:  MMT Right eval Left eval Right 07/28/22 Left  07/28/22  Hip flexion 5/5 3/5  4-  Hip extension 3/5 3/5 4 3+  Hip abduction 5/5 4/5  4+  Hip adduction      Hip internal rotation      Hip external rotation      Knee flexion 5/5 5/5    Knee extension 5/5 5/5    Ankle dorsiflexion      Ankle plantarflexion       Ankle inversion      Ankle eversion       (Blank rows = not tested)  FUNCTIONAL TESTS:  30 seconds chair stand test-12  2 minute walk test: 530 ft noted antalgic gait  Single leg stance: Lt:  20    TODAY'S TREATMENT:                                                                                                                               DATE:  07/30/22 Quadriped: Single arm raise x 10  Piriformis stretch B x 2 for 30" each Opposite arm/leg raise x 5 Fire hydrant x 5 B  Prone on elbow x 2 for 1:00  Press up x 5 Child pose x 2  Supine: Bridge with resisted abduction using green theraband hold 5" x 10 Active hamstring stretch x 3 for 30" B Knee to chest x 3 B for 30 "  Sit to stand x 10 Standing hip excursion x 3  Leg press 6 PL x 15   07/28/22 Progress note/functional testing MMT Functional measures: 30 seconds chair stand test  14 (was 12 at evaluation) 2 minute walk test: NT but now able to walk without antalgic gait (was 530 ft noted antalgic gait)  Single leg stance:  Rt:14" Lt: 30"  Lt: (at evaluation Lt: 20) Standing:  hamstring stretch Lt 3X30"  Vectors 10X5" each LE with 1 UE assist Seated:  piriformis stretch 3X30"  07/13/22: Standing: 3D hip excursion 10X each Vector stance 10x 5" with 1 HHA Hamstring stretch on 12in step 3x 30" Quadruped: UE/LE 10x 5" Fire hydrant 10 x 3" Seated:  midback stretch using stool 5X20" Seated:  long sitting hamstring stretch 3X30" each LE  Piriformis stretch 3X30"  07/10/22: Standing: 3D hip excursion (weight shifting, rotate, squat front of chair then extend) Vector stance 3x 5" with 1 HHA Hamstring stretch on 12in step 3x 30" Quadruped:  UE/LE 10x 5" Fire hydrant 5 x 3" Downward dog 20 reps walking  2x 20" static holds Supine: Piriformis figure 4 with towel assistance 2x 30" Hamstring stretch 3x 30"  07/07/22: Standing: 3D hip excursion (weight shifting, rotate, controlled STS then extend) Vector stance 3x 5" with 1 HHA Quadruped: hip extension 5x 3" Fire hydrant 5 x 3" Downward dog 20 reps walking  2x 20" static holds Supine: Bridge 10x 5" SKTC 2x 30" Piriformis figure 4 with towel  assistance 2x 30"  07/01/22: Goal review Seated:  Piriformis stretch 2X30"  Long sitting Hamstring stretch  2X30" Supine:  bridge 10X  SLR 10X each Rt sidelying:  Lt hip abduction 10X Prone:  Hip extension bil 10X each Standing: Hip abduction and extension instruction (alt way)    Eval:  06/26/22: Knee to chest 3 x 30" Active hamstring stretch 3 x 30" Quadriped pirformis stretch 3 x 30"    PATIENT EDUCATION:  Education details: HEP Person educated: Patient Education method: Theatre stage manager Education comprehension: verbalized understanding and returned demonstration  HOME EXERCISE PROGRAM: Access Code: [email or web address] URL: [email or web address] 07/30/2022 - Quadruped on Forearms Hip Extension  - 1 x daily - 7 x weekly - 1 sets - 10 reps - 3-5" hold - Bird Dog  - 1 x daily - 7 x weekly - 1 sets - 10 reps - 3-5" hold - Quadruped Fire Hydrant  - 1 x daily - 7 x weekly - 1 sets - 10 reps - 3-5" hold - Prone on Elbows Stretch  - 1 x daily - 7 x weekly - 1 sets - 2 reps - 30" hold  07/07/22: Supine piriformis stretch  Date: 07/01/2022 Prepared by: Roseanne Reno Exercises - Seated Piriformis Stretch with Trunk Bend  - 2 x daily - 7 x weekly - 1 sets - 3 reps - 30 sec hold - Seated Hamstring Stretch with Chair  - 2 x daily - 7 x weekly - 1 sets - 3 reps - 30 sec hold - Supine Bridge  - 2 x daily - 7 x weekly - 1 sets - 10 reps - Supine Straight Leg Raises  - 2 x daily - 7 x weekly - 1 sets - 10 reps - Sidelying Hip Abduction  - 2 x daily - 7 x weekly - 1 sets - 10 reps - Prone Hip Extension  - 2 x daily - 7 x weekly - 1 sets - 10 reps  Date: 06/26/2022 (evaluation) Prepared by: Rayetta Humphrey Exercises - Supine Single Knee to Chest Stretch  - 2 x daily - 7 x weekly - 1 sets - 3 reps - 30" hold - Supine Hamstring Stretch  - 2 x daily - 7 x weekly - 1 sets - 3 reps - 30" hold - Quadruped Piriformis Stretch  - 2 x daily - 7 x weekly - 1 sets - 3 reps - 30" hold  ASSESSMENT:  CLINICAL IMPRESSION: Therapist advanced HEP with quadriped exercises as well as leg press.  Pt  pain notably decreased.  Pt will continue to benefit from skilled therapy in order to reduce symptoms and pain.      OBJECTIVE IMPAIRMENTS: decreased activity tolerance, decreased balance, difficulty walking, decreased strength, increased fascial restrictions, impaired flexibility, and pain.   ACTIVITY LIMITATIONS: sitting, squatting, and locomotion level  PARTICIPATION LIMITATIONS: driving and community activity  REHAB POTENTIAL: Good  CLINICAL DECISION MAKING: Stable/uncomplicated  EVALUATION COMPLEXITY: Low   GOALS: Goals reviewed with patient? Yes  SHORT TERM GOALS: Target date: 07/10/22 PT to be I in Hep in order to be able to drive for 30 minutes without increased Pain Baseline: Goal status: IN PROGRESS  2.  Pt to increase strength 1/2 grade to be able to go up and down his steps in a reciprocal manner without difficulty  Baseline:  Goal status: IN PROGRESS; partly met with strength goal   LONG TERM GOALS: Target date: 07/24/22  PT to be I in advanced Hep in order to  be able to drive for 60 minutes without increased Pain Baseline:  Goal status: IN PROGRESS  2.  Pt to increase strength 1 grade to be able to come up from a squatted position without difficulty  Baseline:  Goal status: IN PROGRESS  3.  PT pain in Lt buttock to be no greater than a 2/10 throughout the day  Baseline:  Goal status: IN PROGRESS   PLAN:  PT FREQUENCY: 2x/week  PT DURATION: 4 weeks  PLANNED INTERVENTIONS: Therapeutic exercises, Balance training, Gait training, Patient/Family education, Self Care, and Manual therapy  PLAN FOR NEXT SESSION: Continue with strengthening of gluteal and Lt hip flexor mm, continue with stretching.   Manual if needed for pain relief.  Rayetta Humphrey, [place] CLT [phone]  07/30/2022

## 2022-08-05 ENCOUNTER — Ambulatory Visit (HOSPITAL_COMMUNITY): Payer: Medicare PPO | Admitting: Physical Therapy

## 2022-08-05 DIAGNOSIS — M25552 Pain in left hip: Secondary | ICD-10-CM

## 2022-08-05 DIAGNOSIS — M6281 Muscle weakness (generalized): Secondary | ICD-10-CM

## 2022-08-05 NOTE — Therapy (Signed)
OUTPATIENT PHYSICAL THERAPY TREATMENT Progress Note    Patient Name: Anthony English MRN: [id number] DOB:1951/03/18, 72 y.o., male Today's Date: 07/30/2022      END OF SESSION:   PT End of Session - 07/30/22 1330     Visit Number 7    Number of Visits 8    Date for PT Re-Evaluation 08/20/22    Authorization Type [employer] medicare/BCBS    Progress Note Due on Visit 8    PT Start Time 1345    PT Stop Time 1428    PT Time Calculation (min) 43 min    Activity Tolerance Patient tolerated treatment well    Behavior During Therapy WFL for tasks assessed/performed               Past Medical History:  Diagnosis Date   Arthritis    Gout    Hypercholesteremia    Hypertension    Past Surgical History:  Procedure Laterality Date   BIOPSY  08/07/2021   Procedure: BIOPSY;  Surgeon: Rogene Houston, MD;  [facility];  Service: Endoscopy;;   COLONOSCOPY     COLONOSCOPY     COLONOSCOPY N/A 04/02/2016   Procedure: COLONOSCOPY;  Surgeon: Rogene Houston, MD;  [facility]: [facility];  Service: Endoscopy;  Laterality: N/A;  1230   COLONOSCOPY WITH PROPOFOL N/A 08/07/2021   Procedure: COLONOSCOPY WITH PROPOFOL;  Surgeon: Rogene Houston, MD;  [facility]: [facility];  Service: Endoscopy;  Laterality: N/A;  9:10   POLYPECTOMY  08/07/2021   Procedure: POLYPECTOMY;  Surgeon: Rogene Houston, MD;  [facility]: [facility];  Service: Endoscopy;;   TOTAL HIP ARTHROPLASTY Left 05/29/2016   Procedure: LEFT TOTAL HIP ARTHROPLASTY ANTERIOR APPROACH;  Surgeon: Mcarthur Rossetti, MD;  Location: [facility];  Service: Orthopedics;  Laterality: Left;   VASECTOMY     Patient Active Problem List   Diagnosis Date Noted   Unilateral primary osteoarthritis, left hip 05/29/2016   Status post left hip replacement 05/29/2016    PCP: Asencion Noble  REFERRING PROVIDER: Gentry Fitz,  REFERRING DIAG:  PT eval/tx for lt ischial pain and tight hamstrings; H/O lt THA per Simonne Maffucci, MD     THERAPY DIAG:  Left buttock pain Difficulty in walking  Rationale for Evaluation and Treatment: Rehabilitation  ONSET DATE: off and on for five years.  He has been going to various MD for five years.     SUBJECTIVE STATEMENT:  PT states that his pain is a 0/10.  He drove to [facility] on Saturday and he needed to have 2 tylenol.  He went to the basket ball game and he had to get up and take a rest and had increased pain.  PT feels that since starting therapy he feels about 20% better.   PERTINENT HISTORY: Lt THR- anterior approach 2019  PAIN:  Are you having pain? Yes: NPRS scale: 0/10, after driving will go to a 6/10  Pain location: Lt buttock  Pain description: sharp  Aggravating factors: driving  Relieving factors: meds   PRECAUTIONS: None  WEIGHT BEARING RESTRICTIONS: No  FALLS:  Has patient fallen in last 6 months? 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  PLOF: Independent  PATIENT GOALS: less pain   NEXT MD  VISIT:  no return appointment scheduled  OBJECTIVE:   COGNITION: Overall cognitive status: Within functional limits for tasks assessed     MUSCLE LENGTH: Hamstrings: Right 145 deg; Left 130 deg 08/05/2022:  Right   150     ; Left: 140 POSTURE: No Significant postural limitations  PALPATION: Tight gluteal    LOWER EXTREMITY MMT:  MMT Right eval Left eval Right 07/28/22 Right 08/05/22 Left  07/28/22 Left  08/05/22  Hip flexion 5/5 3/'5 5 5 '$ 4- 4-  Hip extension 3/5 3/5 4 4- 3+ 4-  Hip abduction 5/5 4/'5 5 5 '$ 4+ 5  Hip adduction        Hip internal rotation        Hip external rotation        Knee flexion 5/5 5/5      Knee extension 5/5 5/5      Ankle dorsiflexion        Ankle plantarflexion         Ankle inversion         Ankle eversion         (Blank rows = not tested)  FUNCTIONAL TESTS:  Eval:  30 seconds chair stand test-12 ; 08/05/2022:  13 2 minute walk test: 530 ft noted antalgic gait / 08/05/22:  638 ft with decreased antalgic gait; Single leg stance: Lt:  20 08/05/22: 30" on LT     TODAY'S TREATMENT:                                                                                                                              DATE:  08/05/22 Standing : Deep squat x 10  Single leg stance x 3 with max at 30" Sitting: Sit to stand x 10  Supine:  SLR x 10 on LT  B active hamstring stretch 3 x 30"  Prone:  hip extension B x10 hold for 3" 07/30/22 Quadriped: Single arm raise x 10  Piriformis stretch B x 2 for 30" each Opposite arm/leg raise x 5 Fire hydrant x 5 B  Prone on elbow x 2 for 1:00  Press up x 5 Child pose x 2  Supine: Bridge with resisted abduction using green theraband hold 5" x 10 Active hamstring stretch x 3 for 30" B Knee to chest x 3 B for 30 "  Sit to stand x 10 Standing hip excursion x 3  Leg press 6 PL x 15   07/28/22 Progress note/functional testing MMT Functional measures: 30 seconds chair stand test  14 (was 12 at evaluation) 2 minute walk test: NT but now able to walk without antalgic gait (was 530 ft noted antalgic gait)  Single leg stance:  Rt:14" Lt: 30"  Lt: (at evaluation Lt: 20) Standing:  hamstring stretch Lt 3X30"  Vectors 10X5" each LE with 1 UE assist Seated:  piriformis stretch 3X30"  07/13/22: Standing: 3D hip excursion 10X each Vector stance 10x 5" with 1 HHA  Hamstring stretch on 12in step 3x 30" Quadruped: UE/LE 10x 5" Fire hydrant 10 x 3" Seated:  midback stretch using stool 5X20" Seated:  long sitting hamstring stretch 3X30" each LE  Piriformis stretch 3X30"  07/10/22: Standing: 3D hip excursion (weight shifting, rotate, squat front of chair then extend) Vector stance 3x 5" with 1 HHA Hamstring stretch on 12in step 3x 30" Quadruped:   UE/LE 10x 5" Fire hydrant 5 x 3" Downward dog 20 reps walking  2x 20" static holds Supine: Piriformis figure 4 with towel assistance 2x 30" Hamstring stretch 3x 30"   PATIENT EDUCATION:  Education details: HEP Person educated: Patient Education method: Theatre stage manager Education comprehension: verbalized understanding and returned demonstration  HOME EXERCISE PROGRAM: Access Code: [email or web address] URL: [email or web address] 07/30/2022 - Quadruped on Forearms Hip Extension  - 1 x daily - 7 x weekly - 1 sets - 10 reps - 3-5" hold - Bird Dog  - 1 x daily - 7 x weekly - 1 sets - 10 reps - 3-5" hold - Quadruped Fire Hydrant  - 1 x daily - 7 x weekly - 1 sets - 10 reps - 3-5" hold - Prone on Elbows Stretch  - 1 x daily - 7 x weekly - 1 sets - 2 reps - 30" hold  07/07/22: Supine piriformis stretch  Date: 07/01/2022 Prepared by: Roseanne Reno Exercises - Seated Piriformis Stretch with Trunk Bend  - 2 x daily - 7 x weekly - 1 sets - 3 reps - 30 sec hold - Seated Hamstring Stretch with Chair  - 2 x daily - 7 x weekly - 1 sets - 3 reps - 30 sec hold - Supine Bridge  - 2 x daily - 7 x weekly - 1 sets - 10 reps - Supine Straight Leg Raises  - 2 x daily - 7 x weekly - 1 sets - 10 reps - Sidelying Hip Abduction  - 2 x daily - 7 x weekly - 1 sets - 10 reps - Prone Hip Extension  - 2 x daily - 7 x weekly - 1 sets - 10 reps  Date: 06/26/2022 (evaluation) Prepared by: Rayetta Humphrey Exercises - Supine Single Knee to Chest Stretch  - 2 x daily - 7 x weekly - 1 sets - 3 reps - 30" hold - Supine Hamstring Stretch  - 2 x daily - 7 x weekly - 1 sets - 3 reps - 30" hold - Quadruped Piriformis Stretch  - 2 x daily - 7 x weekly - 1 sets - 3 reps - 30" hold  ASSESSMENT:  CLINICAL IMPRESSION:  Pt reassessed he has met all of his STG but none of his LTG>  PT pain is only when sitting.  Therapist had pt order a McKenzie roll and begin back extension as we have been concentrating on the  weakness of pt Lt LE as well as tight hip mm, however, pt pain maybe back related.  Pt will benefit from two more treatments once a week to assess how extension is affecting his buttock pain.  OBJECTIVE IMPAIRMENTS: decreased activity tolerance, decreased balance, difficulty walking, decreased strength, increased fascial restrictions, impaired flexibility, and pain.   ACTIVITY LIMITATIONS: sitting, squatting, and locomotion level  PARTICIPATION LIMITATIONS: driving and community activity  REHAB POTENTIAL: Good  CLINICAL DECISION MAKING: Stable/uncomplicated  EVALUATION COMPLEXITY: Low   GOALS: Goals reviewed with patient? Yes  SHORT TERM GOALS: Target date: 07/10/22 PT to be I in Hep in order to be able to drive  for 30 minutes without increased Pain Baseline: Goal status: INITIAL  2.  Pt to increase strength 1/2 grade to be able to go up and down his steps in a reciprocal manner without difficulty  Baseline:  Goal status: MET; partly met with strength goal   LONG TERM GOALS: Target date: 07/24/22  PT to be I in advanced Hep in order to be able to drive for 60 minutes without increased Pain Baseline:  Goal status: IN PROGRESS  2.  Pt to increase strength 1 grade to be able to come up from a squatted position without difficulty  Baseline:  Goal status: IN PROGRESS  3.  PT pain in Lt buttock to be no greater than a 2/10 throughout the day  Baseline:  Goal status: IN PROGRESS   PLAN:  PT FREQUENCY: 2x/week  PT DURATION: 4 weeks  PLANNED INTERVENTIONS: Therapeutic exercises, Balance training, Gait training, Patient/Family education, Self Care, and Manual therapy  PLAN FOR NEXT SESSION: Continue with strengthening of gluteal and Lt hip flexor mm, continue with stretching.   Manual if needed for pain relief.  Rayetta Humphrey, [place] CLT [phone]

## 2022-08-11 ENCOUNTER — Ambulatory Visit (HOSPITAL_COMMUNITY): Payer: Medicare PPO | Admitting: Physical Therapy

## 2022-08-11 DIAGNOSIS — M6281 Muscle weakness (generalized): Secondary | ICD-10-CM

## 2022-08-11 DIAGNOSIS — M25552 Pain in left hip: Secondary | ICD-10-CM | POA: Diagnosis not present

## 2022-08-11 NOTE — Therapy (Signed)
OUTPATIENT PHYSICAL THERAPY TREATMENT  Patient Name: Anthony English MRN: [id number] DOB:[birth year], 72 y.o., male Today's Date: 08/11/2022     END OF SESSION:   PT End of Session - 08/11/22 0949     Visit Number 8    Number of Visits 14    Date for PT Re-Evaluation 08/26/22    Authorization Type [employer] health medicare/BCBS    Progress Note Due on Visit 17    PT Start Time [phone]    PT Stop Time 1028    PT Time Calculation (min) 40 min    Activity Tolerance Patient tolerated treatment well    Behavior During Therapy [facility] for tasks assessed/performed               Past Medical History:  Diagnosis Date   Arthritis    Gout    Hypercholesteremia    Hypertension    Past Surgical History:  Procedure Laterality Date   BIOPSY  08/07/2021   Procedure: BIOPSY;  Surgeon: Rogene Houston, MD;  [facility]: [facility];  Service: Endoscopy;;   COLONOSCOPY     COLONOSCOPY     COLONOSCOPY N/A 04/02/2016   Procedure: COLONOSCOPY;  Surgeon: Rogene Houston, MD;  [facility];  Service: Endoscopy;  Laterality: N/A;  1230   COLONOSCOPY WITH PROPOFOL N/A 08/07/2021   Procedure: COLONOSCOPY WITH PROPOFOL;  Surgeon: Rogene Houston, MD;  [facility];  Service: Endoscopy;  Laterality: N/A;  9:10   POLYPECTOMY  08/07/2021   Procedure: POLYPECTOMY;  Surgeon: Rogene Houston, MD;  [facility];  Service: Endoscopy;;   TOTAL HIP ARTHROPLASTY Left 05/29/2016   Procedure: LEFT TOTAL HIP ARTHROPLASTY ANTERIOR APPROACH;  Surgeon: Mcarthur Rossetti, MD;  Location: [facility];  Service: Orthopedics;  Laterality: Left;   VASECTOMY     Patient Active Problem List   Diagnosis Date Noted   Unilateral primary osteoarthritis, left hip 05/29/2016   Status post left hip replacement 05/29/2016    PCP: Asencion Noble  REFERRING PROVIDER: Gentry Fitz,  REFERRING DIAG:  PT eval/tx for lt ischial pain and tight hamstrings; H/O lt THA per Simonne Maffucci, MD    THERAPY  DIAG:  Left buttock pain Difficulty in walking  Rationale for Evaluation and Treatment: Rehabilitation  ONSET DATE: off and on for five years.  He has been going to various MD for five years.     SUBJECTIVE STATEMENT:  PT states that his pain is a 0/10 currently.   PERTINENT HISTORY: Lt THR- anterior approach 2019  PAIN:  Are you having pain? Yes: NPRS scale: 0/10, after driving will go to a 6/10  Pain location: Lt buttock  Pain description: sharp  Aggravating factors: driving  Relieving factors: meds   PRECAUTIONS: None  WEIGHT BEARING RESTRICTIONS: No  FALLS:  Has patient fallen in last 6 months? 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  PLOF: Independent  PATIENT GOALS: less pain   NEXT MD VISIT:  no return appointment scheduled  OBJECTIVE:   COGNITION: Overall cognitive status: Within functional limits for tasks assessed     MUSCLE LENGTH: Hamstrings: Right 145 deg; Left 130 deg 08/05/2022:  Right 150;  Left 140  POSTURE: No Significant postural limitations  PALPATION: Tight gluteal  LOWER EXTREMITY MMT:  MMT Right eval Left eval Right 07/28/22 Right 08/05/22 Left  07/28/22 Left  08/05/22  Hip flexion 5/5 3/5 5 5 $ 4- 4-  Hip extension 3/5 3/5 4 4- 3+ 4-  Hip abduction 5/5 4/5 5 5 $ 4+ 5  Hip adduction        Hip internal rotation        Hip external rotation        Knee flexion 5/5 5/5      Knee extension 5/5 5/5      Ankle dorsiflexion        Ankle plantarflexion         Ankle inversion        Ankle eversion         (Blank rows = not tested)  FUNCTIONAL TESTS:  Eval:  30 seconds chair stand test-12 ; 08/05/2022:  13 2 minute walk test: 530 ft noted antalgic gait / 08/05/22:  638 ft with decreased antalgic  gait; Single leg stance: Lt:  20 08/05/22: 30" on LT    TODAY'S TREATMENT:                                                                                                                              DATE:  08/11/22 Standing:  Heelraises 20X  Deep squats 10X2  Vectors with 1 UE assist 10X5" each LE  Standing hamstring stretch with 12" step 3X30" each Seated:  Piriformis stretch 30" each X2  Sit to stands 10X no UE assist Supine:  bridge with GTB resistance holds 15X   08/05/22 Standing : Deep squat x 10  Single leg stance x 3 with max at 30" Sitting: Sit to stand x 10  Supine:  SLR x 10 on LT  B active hamstring stretch 3 x 30"  Prone:  hip extension B x10 hold for 3"  07/30/22 Quadriped: Single arm raise x 10  Piriformis stretch B x 2 for 30" each Opposite arm/leg raise x 5 Fire hydrant x 5 B  Prone on elbow x 2 for 1:00  Press up x 5 Child pose x 2  Supine: Bridge with resisted abduction using green theraband hold 5" x 10 Active hamstring stretch x 3 for 30" B Knee to chest x 3 B for 30 "  Sit to stand x 10 Standing hip excursion x 3  Leg press 6 PL x 15   07/28/22 Progress note/functional testing MMT Functional measures: 30 seconds chair stand test  14 (was 12 at evaluation) 2 minute walk test: NT but now able to walk without antalgic gait (was 530 ft noted antalgic gait)  Single leg stance:  Rt:14" Lt: 30"  Lt: (at evaluation Lt: 20) Standing:  hamstring stretch Lt 3X30"  Vectors 10X5" each LE with 1 UE assist Seated:  piriformis stretch 3X30"  07/13/22: Standing: 3D hip excursion 10X each Vector stance 10x 5" with 1 HHA Hamstring stretch on 12in step 3x  30" Quadruped: UE/LE 10x 5" Fire hydrant 10 x 3" Seated:  midback stretch using stool 5X20" Seated:  long sitting hamstring stretch 3X30" each LE  Piriformis stretch 3X30"  07/10/22: Standing: 3D hip excursion (weight shifting, rotate, squat front of chair then extend) Vector stance 3x 5" with 1  HHA Hamstring stretch on 12in step 3x 30" Quadruped:  UE/LE 10x 5" Fire hydrant 5 x 3" Downward dog 20 reps walking  2x 20" static holds Supine: Piriformis figure 4 with towel assistance 2x 30" Hamstring stretch 3x 30"   PATIENT EDUCATION:  Education details: HEP Person educated: Patient Education method: Theatre stage manager Education comprehension: verbalized understanding and returned demonstration  HOME EXERCISE PROGRAM: Access Code: [email or web address] URL: [email or web address] 07/30/2022 - Quadruped on Forearms Hip Extension  - 1 x daily - 7 x weekly - 1 sets - 10 reps - 3-5" hold - Bird Dog  - 1 x daily - 7 x weekly - 1 sets - 10 reps - 3-5" hold - Quadruped Fire Hydrant  - 1 x daily - 7 x weekly - 1 sets - 10 reps - 3-5" hold - Prone on Elbows Stretch  - 1 x daily - 7 x weekly - 1 sets - 2 reps - 30" hold  07/07/22: Supine piriformis stretch  Date: 07/01/2022 Prepared by: Roseanne Reno Exercises - Seated Piriformis Stretch with Trunk Bend  - 2 x daily - 7 x weekly - 1 sets - 3 reps - 30 sec hold - Seated Hamstring Stretch with Chair  - 2 x daily - 7 x weekly - 1 sets - 3 reps - 30 sec hold - Supine Bridge  - 2 x daily - 7 x weekly - 1 sets - 10 reps - Supine Straight Leg Raises  - 2 x daily - 7 x weekly - 1 sets - 10 reps - Sidelying Hip Abduction  - 2 x daily - 7 x weekly - 1 sets - 10 reps - Prone Hip Extension  - 2 x daily - 7 x weekly - 1 sets - 10 reps  Date: 06/26/2022 (evaluation) Prepared by: Rayetta Humphrey Exercises - Supine Single Knee to Chest Stretch  - 2 x daily - 7 x weekly - 1 sets - 3 reps - 30" hold - Supine Hamstring Stretch  - 2 x daily - 7 x weekly - 1 sets - 3 reps - 30" hold - Quadruped Piriformis Stretch  - 2 x daily - 7 x weekly - 1 sets - 3 reps - 30" hold  ASSESSMENT:  CLINICAL IMPRESSION:  Continued with focus on increasing LE strength where weak and flexibility where tight to meet remaining LTG's.   Has not reported use of  McKenzie roll yet so will need follow up next session. Pt with noted tightness in hamstrings and piriformis, encouraged continue participation with these stretches as part of HEP as well as extension.   Continue X 1 more treatment per PT's POC.   OBJECTIVE IMPAIRMENTS: decreased activity tolerance, decreased balance, difficulty walking, decreased strength, increased fascial restrictions, impaired flexibility, and pain.   ACTIVITY LIMITATIONS: sitting, squatting, and locomotion level  PARTICIPATION LIMITATIONS: driving and community activity  REHAB POTENTIAL: Good  CLINICAL DECISION MAKING: Stable/uncomplicated  EVALUATION COMPLEXITY: Low   GOALS: Goals reviewed with patient? Yes  SHORT TERM GOALS: Target date: 07/10/22 PT to be I in Hep in order to be able to drive for 30 minutes without increased Pain Baseline: Goal status: INITIAL  2.  Pt to increase  strength 1/2 grade to be able to go up and down his steps in a reciprocal manner without difficulty  Baseline:  Goal status: MET; partly met with strength goal   LONG TERM GOALS: Target date: 07/24/22  PT to be I in advanced Hep in order to be able to drive for 60 minutes without increased Pain Baseline:  Goal status: IN PROGRESS  2.  Pt to increase strength 1 grade to be able to come up from a squatted position without difficulty  Baseline:  Goal status: IN PROGRESS  3.  PT pain in Lt buttock to be no greater than a 2/10 throughout the day  Baseline:  Goal status: IN PROGRESS   PLAN:  PT FREQUENCY: 2x/week  PT DURATION: 4 weeks  PLANNED INTERVENTIONS: Therapeutic exercises, Balance training, Gait training, Patient/Family education, Self Care, and Manual therapy  PLAN FOR NEXT SESSION: Continue with strengthening of gluteal and Lt hip flexor mm, continue with stretching.   Manual if needed for pain relief. Follow up with use of lumbar roll, extension benefits.   Teena Irani, PTA/CLT [place] Ph: [phone]   Teena Irani, PTA 08/11/2022, 10:07 AM

## 2022-08-12 ENCOUNTER — Encounter (HOSPITAL_COMMUNITY): Payer: Medicare PPO | Admitting: Physical Therapy

## 2022-08-18 ENCOUNTER — Ambulatory Visit (HOSPITAL_COMMUNITY): Payer: Medicare PPO | Admitting: Physical Therapy

## 2022-08-18 DIAGNOSIS — M6281 Muscle weakness (generalized): Secondary | ICD-10-CM | POA: Diagnosis not present

## 2022-08-18 DIAGNOSIS — M25552 Pain in left hip: Secondary | ICD-10-CM | POA: Diagnosis not present

## 2022-08-18 NOTE — Therapy (Signed)
OUTPATIENT PHYSICAL THERAPY TREATMENT  Patient Name: Anthony English MRN: [id number] DOB:[birth year], 72 y.o., male Today's Date: 08/18/2022     END OF SESSION:   PT End of Session - 08/18/22 1113    Visit Number 9    Number of Visits 14    Date for PT Re-Evaluation 08/26/22    Authorization Type [employer] medicare/BCBS    Progress Note Due on Visit 17    PT Start Time 1029    PT Stop Time 1113    PT Time Calculation (min) 44 min    Activity Tolerance Patient tolerated treatment well    Behavior During Therapy WFL for tasks assessed/performed           Past Medical History:  Diagnosis Date   Arthritis    Gout    Hypercholesteremia    Hypertension    Past Surgical History:  Procedure Laterality Date   BIOPSY  08/07/2021   Procedure: BIOPSY;  Surgeon: Rogene Houston, MD;  [facility]: [facility];  Service: Endoscopy;;   COLONOSCOPY     COLONOSCOPY     COLONOSCOPY N/A 04/02/2016   Procedure: COLONOSCOPY;  Surgeon: Rogene Houston, MD;  [facility]: [facility];  Service: Endoscopy;  Laterality: N/A;  1230   COLONOSCOPY WITH PROPOFOL N/A 08/07/2021   Procedure: COLONOSCOPY WITH PROPOFOL;  Surgeon: Rogene Houston, MD;  [facility]: [facility];  Service: Endoscopy;  Laterality: N/A;  9:10   POLYPECTOMY  08/07/2021   Procedure: POLYPECTOMY;  Surgeon: Rogene Houston, MD;  [facility]: [facility];  Service: Endoscopy;;   TOTAL HIP ARTHROPLASTY Left 05/29/2016   Procedure: LEFT TOTAL HIP ARTHROPLASTY ANTERIOR APPROACH;  Surgeon: Mcarthur Rossetti, MD;  Location: [facility];  Service: Orthopedics;  Laterality: Left;   VASECTOMY     Patient Active Problem List   Diagnosis Date Noted   Unilateral primary osteoarthritis, left hip 05/29/2016   Status post left hip replacement 05/29/2016    PCP: Asencion Noble  REFERRING PROVIDER: Gentry Fitz,  REFERRING DIAG:  PT eval/tx for lt ischial pain and tight hamstrings; H/O lt THA per Simonne Maffucci, MD    THERAPY DIAG:   Left buttock pain Difficulty in walking  Rationale for Evaluation and Treatment: Rehabilitation  ONSET DATE: off and on for five years.  He has been going to various MD for five years.     SUBJECTIVE STATEMENT: Pt states his buttock continues to bother him after driving for about 30 minutes.  PERTINENT HISTORY: Lt THR- anterior approach 2019  PAIN:  Are you having pain? Yes: NPRS scale: 0/10, after driving will go to a 6/10  Pain location: Lt buttock  Pain description: sharp  Aggravating factors: driving  Relieving factors: meds   PRECAUTIONS: None  WEIGHT BEARING RESTRICTIONS: No  FALLS:  Has patient fallen in last 6 months? 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  PLOF: Independent  PATIENT GOALS: less pain   NEXT MD VISIT:  no return appointment scheduled  OBJECTIVE:   COGNITION: Overall cognitive status: Within functional limits for tasks assessed     MUSCLE LENGTH: Hamstrings: Right 145 deg; Left 130 deg 08/05/2022:  Right 150;  Left 140  POSTURE: No Significant postural limitations  PALPATION: Tight gluteal    LOWER  EXTREMITY MMT:  MMT Right eval Left eval Right 07/28/22 Right 08/05/22 Left  07/28/22 Left  08/05/22  Hip flexion 5/5 3/5 5 5 $ 4- 4-  Hip extension 3/5 3/5 4 4- 3+ 4-  Hip abduction 5/5 4/5 5 5 $ 4+ 5  Hip adduction        Hip internal rotation        Hip external rotation        Knee flexion 5/5 5/5      Knee extension 5/5 5/5      Ankle dorsiflexion        Ankle plantarflexion         Ankle inversion        Ankle eversion         (Blank rows = not tested)  FUNCTIONAL TESTS:  Eval:  30 seconds chair stand test-12 ; 08/05/2022:  13 2 minute walk test: 530 ft noted antalgic gait / 08/05/22:  638 ft with  decreased antalgic gait; Single leg stance: Lt:  20 08/05/22: 30" on LT    TODAY'S TREATMENT:                                                                                                                              DATE:  08/18/22 Standing: Marching knee even with hip x 5 " 10 x each  Hamstring stretch 3 x 30" B with foot onto chair  Vector stance x 3 x10" Supine: LT SLR x 5 x 2 sets Knee to chest 30" x 2  Bridge hold x 10"  x 10   Prone: Prone on elbow with 2 deep breaths x 5  Quadriped:  single leg raise x 10 B   08/11/22 Standing:  Heelraises 20X  Deep squats 10X2  Vectors with 1 UE assist 10X5" each LE  Standing hamstring stretch with 12" step 3X30" each Seated:  Piriformis stretch 30" each X2  Sit to stands 10X no UE assist Supine:  bridge with GTB resistance holds 15X   08/05/22 Standing : Deep squat x 10  Single leg stance x 3 with max at 30" Sitting: Sit to stand x 10  Supine:  SLR x 10 on LT  B active hamstring stretch 3 x 30"  Prone:  hip extension B x10 hold for 3"  07/30/22 Quadriped: Single arm raise x 10  Piriformis stretch B x 2 for 30" each Opposite arm/leg raise x 5 Fire hydrant x 5 B  Prone on elbow x 2 for 1:00  Press up x 5 Child pose x 2  Supine: Bridge with resisted abduction using green theraband hold 5" x 10 Active hamstring stretch x 3 for 30" B Knee to chest x 3 B for 30 "  Sit to stand x 10 Standing hip excursion x 3  Leg press 6 PL x 15   07/28/22 Progress note/functional testing MMT Functional measures: 30 seconds chair stand test  14 (was 12 at evaluation)  2 minute walk test: NT but now able to walk without antalgic gait (was 530 ft noted antalgic gait)  Single leg stance:  Rt:14" Lt: 30"  Lt: (at evaluation Lt: 20) Standing:  hamstring stretch Lt 3X30"  Vectors 10X5" each LE with 1 UE assist Seated:  piriformis stretch 3X30"  07/13/22: Standing: 3D hip excursion 10X each Vector stance 10x 5" with 1 HHA Hamstring stretch  on 12in step 3x 30" Quadruped: UE/LE 10x 5" Fire hydrant 10 x 3" Seated:  midback stretch using stool 5X20" Seated:  long sitting hamstring stretch 3X30" each LE  Piriformis stretch 3X30"  07/10/22: Standing: 3D hip excursion (weight shifting, rotate, squat front of chair then extend) Vector stance 3x 5" with 1 HHA Hamstring stretch on 12in step 3x 30" Quadruped:  UE/LE 10x 5" Fire hydrant 5 x 3" Downward dog 20 reps walking  2x 20" static holds Supine: Piriformis figure 4 with towel assistance 2x 30" Hamstring stretch 3x 30"   PATIENT EDUCATION:  Education details: HEP Person educated: Patient Education method: Theatre stage manager Education comprehension: verbalized understanding and returned demonstration  HOME EXERCISE PROGRAM: Access Code: [email or web address] URL: [email or web address] 07/30/2022 - Quadruped on Forearms Hip Extension  - 1 x daily - 7 x weekly - 1 sets - 10 reps - 3-5" hold - Bird Dog  - 1 x daily - 7 x weekly - 1 sets - 10 reps - 3-5" hold - Quadruped Fire Hydrant  - 1 x daily - 7 x weekly - 1 sets - 10 reps - 3-5" hold - Prone on Elbows Stretch  - 1 x daily - 7 x weekly - 1 sets - 2 reps - 30" hold  07/07/22: Supine piriformis stretch  Date: 07/01/2022 Prepared by: Roseanne Reno Exercises - Seated Piriformis Stretch with Trunk Bend  - 2 x daily - 7 x weekly - 1 sets - 3 reps - 30 sec hold - Seated Hamstring Stretch with Chair  - 2 x daily - 7 x weekly - 1 sets - 3 reps - 30 sec hold - Supine Bridge  - 2 x daily - 7 x weekly - 1 sets - 10 reps - Supine Straight Leg Raises  - 2 x daily - 7 x weekly - 1 sets - 10 reps - Sidelying Hip Abduction  - 2 x daily - 7 x weekly - 1 sets - 10 reps - Prone Hip Extension  - 2 x daily - 7 x weekly - 1 sets - 10 reps  Date: 06/26/2022 (evaluation) Prepared by: Rayetta Humphrey Exercises - Supine Single Knee to Chest Stretch  - 2 x daily - 7 x weekly - 1 sets - 3 reps - 30" hold - Supine Hamstring Stretch  -  2 x daily - 7 x weekly - 1 sets - 3 reps - 30" hold - Quadruped Piriformis Stretch  - 2 x daily - 7 x weekly - 1 sets - 3 reps - 30" hold  ASSESSMENT:  CLINICAL IMPRESSION: PT treatment focused on balance and strengthening gluteal mm on Lt ans well as stretching.  Pt continues to have decreased strength in his Lt hip as well as increased pain with prolong sitting.  Pt will continue to benefit from skilled PT once a week to monitor HEP for five more weeks.  Recert at the end of this month.   OBJECTIVE IMPAIRMENTS: decreased activity tolerance, decreased balance, difficulty walking, decreased strength, increased fascial restrictions, impaired flexibility, and pain.   ACTIVITY LIMITATIONS: sitting,  squatting, and locomotion level  PARTICIPATION LIMITATIONS: driving and community activity  REHAB POTENTIAL: Good  CLINICAL DECISION MAKING: Stable/uncomplicated  EVALUATION COMPLEXITY: Low   GOALS: Goals reviewed with patient? Yes  SHORT TERM GOALS: Target date: 07/10/22 PT to be I in Hep in order to be able to drive for 30 minutes without increased Pain Baseline: Goal status: INITIAL  2.  Pt to increase strength 1/2 grade to be able to go up and down his steps in a reciprocal manner without difficulty  Baseline:  Goal status: MET; partly met with strength goal   LONG TERM GOALS: Target date: 07/24/22  PT to be I in advanced Hep in order to be able to drive for 60 minutes without increased Pain Baseline:  Goal status: IN PROGRESS  2.  Pt to increase strength 1 grade to be able to come up from a squatted position without difficulty  Baseline:  Goal status: IN PROGRESS  3.  PT pain in Lt buttock to be no greater than a 2/10 throughout the day  Baseline:  Goal status: IN PROGRESS   PLAN:  PT FREQUENCY: 2x/week  PT DURATION: 4 weeks  PLANNED INTERVENTIONS: Therapeutic exercises, Balance training, Gait training, Patient/Family education, Self Care, and Manual therapy  PLAN  FOR NEXT SESSION: Continue with strengthening of gluteal and Lt hip flexor mm, continue with stretching.   Manual if needed for pain relief. Follow up with use of lumbar roll, extension benefits.   Kais Monje,CINDY, PT 08/18/2022,  11:18

## 2022-08-24 ENCOUNTER — Encounter (HOSPITAL_COMMUNITY): Payer: Medicare PPO | Admitting: Physical Therapy

## 2023-01-12 DIAGNOSIS — I10 Essential (primary) hypertension: Secondary | ICD-10-CM | POA: Diagnosis not present

## 2023-01-12 DIAGNOSIS — M109 Gout, unspecified: Secondary | ICD-10-CM | POA: Diagnosis not present

## 2023-04-28 DIAGNOSIS — H2513 Age-related nuclear cataract, bilateral: Secondary | ICD-10-CM | POA: Diagnosis not present

## 2023-04-29 DIAGNOSIS — Z23 Encounter for immunization: Secondary | ICD-10-CM | POA: Diagnosis not present

## 2023-06-17 ENCOUNTER — Other Ambulatory Visit (INDEPENDENT_AMBULATORY_CARE_PROVIDER_SITE_OTHER): Payer: Medicare PPO

## 2023-06-17 ENCOUNTER — Ambulatory Visit: Payer: Medicare PPO | Admitting: Physician Assistant

## 2023-06-17 ENCOUNTER — Encounter: Payer: Self-pay | Admitting: Physician Assistant

## 2023-06-17 DIAGNOSIS — M5442 Lumbago with sciatica, left side: Secondary | ICD-10-CM | POA: Diagnosis not present

## 2023-06-17 DIAGNOSIS — M25552 Pain in left hip: Secondary | ICD-10-CM | POA: Diagnosis not present

## 2023-06-17 DIAGNOSIS — G8929 Other chronic pain: Secondary | ICD-10-CM

## 2023-06-17 MED ORDER — METHYLPREDNISOLONE 4 MG PO TBPK: ORAL_TABLET | ORAL | 0 refills | Status: AC

## 2023-06-17 NOTE — Progress Notes (Signed)
Office Visit Note   Patient: Anthony English           Date of Birth: August 08, 1950           MRN: [id number] Visit Date: 06/17/2023              Requested by: Carylon Perches, MD [address] [place],  [place] [address] PCP: Carylon Perches, MD   Assessment & Plan: Visit Diagnoses:  1. Pain of left hip   2. Chronic low back pain with left-sided sciatica, unspecified back pain laterality     Plan: Patient is a pleasant 72 year old gentleman with a chief complaint of lower back pain radiating into his left hip.  He says this has happened for the last few years as winter approaches.  Mostly been treated by his primary care with steroid shots and a Toradol shot.  Toradol shots had worked well 1 year did not work as well another year.  He has taken some oral Toradol that he had at home as well as ibuprofen has not seemed to help.  He feels like his leg is getting heavy and he has to lift it up to put it in the car.  Cannot seem to find a comfortable position especially at night.  He does have degenerative changes on his x-ray.  He has failed in the past with injections even this year as well as activity modification he has tried physical therapy that did not improve his symptoms.  At this point I think given his radicular symptoms and chronicity of the problem I would recommend an MRI.  If the MRI shows something that may be injected below by Dr. Alvester Morin would certainly refer him for follow-up with him  Follow-Up Instructions: Will call after MRI  Orders:  Orders Placed This Encounter  Procedures   XR HIP UNILAT W OR W/O PELVIS 2-3 VIEWS LEFT   XR Lumbar Spine 2-3 Views   MR Lumbar Spine w/o contrast   No orders of the defined types were placed in this encounter.     Procedures: No procedures performed   Clinical Data: No additional findings.   Subjective: Chief Complaint  Patient presents with   Left Hip - Pain    HPI Pleasant 72 year old gentleman with a chief complaint of low  back pain radiating to his left hip.  Denies any injury but acknowledges a history of this the last few years especially as winter approaches denies any loss of bowel or bladder control has tried injectable steroids in the past that have helped but not as much recently.  Has tried physical therapy without success no fever or chills Review of Systems  All other systems reviewed and are negative.    Objective: Vital Signs: There were no vitals taken for this visit.  Physical Exam Constitutional:      Appearance: Normal appearance.  Pulmonary:     Effort: Pulmonary effort is normal.  Skin:    General: Skin is warm and dry.  Neurological:     General: No focal deficit present.     Mental Status: He is alert.  Psychiatric:        Mood and Affect: Mood normal.     Ortho Exam Has pain when transitioning positions.  Has pain with extension of his back.  Positive straight leg raise on the left reproduces pain in his left buttock.  No pain with manipulation of his hip.  No redness no erythema no step-offs on his back strength  is good just limited by the pain he is having with dorsiflexion plantarflexion extension and flexion of his leg compartments are soft and nontender no evidence of DVT Specialty Comments:  No specialty comments available.  Imaging: XR HIP UNILAT W OR W/O PELVIS 2-3 VIEWS LEFT Result Date: 06/17/2023 Radiographs of his left hip demonstrate findings consistent with left hip replacement.  No evidence of any lucency or loosening no fractures  XR Lumbar Spine 2-3 Views Result Date: 06/17/2023 Radiographs of his lumbar spine demonstrate degenerative changes L4-5 L5-S1 no acute fractures    PMFS History: Patient Active Problem List   Diagnosis Date Noted   Unilateral primary osteoarthritis, left hip 05/29/2016   Status post left hip replacement 05/29/2016   Past Medical History:  Diagnosis Date   Arthritis    Gout    Hypercholesteremia    Hypertension      Family History  Problem Relation Age of Onset   Colon cancer Neg Hx     Past Surgical History:  Procedure Laterality Date   BIOPSY  08/07/2021   Procedure: BIOPSY;  Surgeon: Malissa Hippo, MD;  [facility]: [facility];  Service: Endoscopy;;   COLONOSCOPY     COLONOSCOPY     COLONOSCOPY N/A 04/02/2016   Procedure: COLONOSCOPY;  Surgeon: Malissa Hippo, MD;  [facility];  Service: Endoscopy;  Laterality: N/A;  1230   COLONOSCOPY WITH PROPOFOL N/A 08/07/2021   Procedure: COLONOSCOPY WITH PROPOFOL;  Surgeon: Malissa Hippo, MD;  [facility];  Service: Endoscopy;  Laterality: N/A;  9:10   POLYPECTOMY  08/07/2021   Procedure: POLYPECTOMY;  Surgeon: Malissa Hippo, MD;  [facility];  Service: Endoscopy;;   TOTAL HIP ARTHROPLASTY Left 05/29/2016   Procedure: LEFT TOTAL HIP ARTHROPLASTY ANTERIOR APPROACH;  Surgeon: Kathryne Hitch, MD;  Location: [facility];  Service: Orthopedics;  Laterality: Left;   VASECTOMY     Social History   Occupational History   Not on file  Tobacco Use   Smoking status: Never   Smokeless tobacco: Former    Quit date: 04/02/1990  Vaping Use   Vaping status: Never Used  Substance and Sexual Activity   Alcohol use: Yes    Comment: occasional   Drug use: No   Sexual activity: Not on file

## 2023-07-07 ENCOUNTER — Ambulatory Visit
Admission: RE | Admit: 2023-07-07 | Discharge: 2023-07-07 | Disposition: A | Discharge status: Home / Self Care | Payer: 59 | Source: Ambulatory Visit | Attending: Physician Assistant | Admitting: Physician Assistant

## 2023-07-07 DIAGNOSIS — M47816 Spondylosis without myelopathy or radiculopathy, lumbar region: Secondary | ICD-10-CM | POA: Diagnosis not present

## 2023-07-07 DIAGNOSIS — G8929 Other chronic pain: Secondary | ICD-10-CM

## 2023-07-10 ENCOUNTER — Other Ambulatory Visit: Payer: Medicare PPO

## 2023-07-12 DIAGNOSIS — I10 Essential (primary) hypertension: Secondary | ICD-10-CM | POA: Diagnosis not present

## 2023-07-12 DIAGNOSIS — E875 Hyperkalemia: Secondary | ICD-10-CM | POA: Diagnosis not present

## 2023-07-12 DIAGNOSIS — E785 Hyperlipidemia, unspecified: Secondary | ICD-10-CM | POA: Diagnosis not present

## 2023-07-12 DIAGNOSIS — Z79899 Other long term (current) drug therapy: Secondary | ICD-10-CM | POA: Diagnosis not present

## 2023-07-12 DIAGNOSIS — M1000 Idiopathic gout, unspecified site: Secondary | ICD-10-CM | POA: Diagnosis not present

## 2023-07-16 DIAGNOSIS — I10 Essential (primary) hypertension: Secondary | ICD-10-CM | POA: Diagnosis not present

## 2023-07-16 DIAGNOSIS — E785 Hyperlipidemia, unspecified: Secondary | ICD-10-CM | POA: Diagnosis not present

## 2023-07-16 DIAGNOSIS — M109 Gout, unspecified: Secondary | ICD-10-CM | POA: Diagnosis not present

## 2023-07-16 DIAGNOSIS — R5383 Other fatigue: Secondary | ICD-10-CM | POA: Diagnosis not present

## 2023-07-22 DIAGNOSIS — D485 Neoplasm of uncertain behavior of skin: Secondary | ICD-10-CM | POA: Diagnosis not present

## 2023-07-22 DIAGNOSIS — L233 Allergic contact dermatitis due to drugs in contact with skin: Secondary | ICD-10-CM | POA: Diagnosis not present

## 2023-07-22 DIAGNOSIS — C44222 Squamous cell carcinoma of skin of right ear and external auricular canal: Secondary | ICD-10-CM | POA: Diagnosis not present

## 2023-07-27 ENCOUNTER — Other Ambulatory Visit (INDEPENDENT_AMBULATORY_CARE_PROVIDER_SITE_OTHER): Payer: 59

## 2023-07-27 ENCOUNTER — Ambulatory Visit (INDEPENDENT_AMBULATORY_CARE_PROVIDER_SITE_OTHER): Payer: 59 | Admitting: Physician Assistant

## 2023-07-27 DIAGNOSIS — M25562 Pain in left knee: Secondary | ICD-10-CM | POA: Diagnosis not present

## 2023-07-27 MED ORDER — MELOXICAM 7.5 MG PO TABS: 7.5000 mg | ORAL_TABLET | Freq: Every day | ORAL | 0 refills | Status: AC

## 2023-07-27 NOTE — Progress Notes (Signed)
Office Visit Note   Patient: Anthony English           Date of Birth: 12-Dec-1950           MRN: [id number] Visit Date: 07/27/2023              Requested by: Carylon Perches, MD [address] [place],  [place] [address] PCP: Carylon Perches, MD  Chief Complaint  Patient presents with   Left Knee - Pain      HPI: Anthony English is a pleasant 73 year old gentleman who I have seen for low back problems in the past.  He did recently have an MRI and I did review this with him which showed some degenerative changes.  His back symptoms have gotten significantly better.  He comes in today because he has had a 5-day history of pain right behind his knee.  Denies any injury denies any fever or chills.  Denies any calf pain.  Denies any previous history of problems with his knees  Assessment & Plan: Visit Diagnoses:  1. Acute pain of left knee     Plan: Exam today is fairly benign and negative Denna Haggard' sign his compartments are soft and supple no evidence of cellulitis has good dorsiflexion and plantarflexion of his ankle.  Seems to be consistent with a Baker's cyst.  We talked about the natural history of this.  I will put him on a course of meloxicam.  Should not take Motrin with this.  Can follow-up with me if it does not get better.  Have given him red flags including pain into the calf redness erythema fever malaise.  We did briefly discuss his MRI as his back is fairly asymptomatic right now would have him call me if he has return of symptoms and would refer him to Dr. Alvester Morin  Follow-Up Instructions: No follow-ups on file.   Ortho Exam  Patient is alert, oriented, no adenopathy, well-dressed, normal affect, normal respiratory effort. Examination of his left knee he has good extension and flexion no tenderness in the anterior medial and patellofemoral joint line no real crepitus.  No swelling in the calf compartments are soft and compressible no pain with palpation of the calf negative Homans' sign does  have a little fullness and pain behind the knee.  Good stability  Imaging: XR Knee 1-2 Views Left Result Date: 07/27/2023 Radiographs of his left knee were reviewed in 2 projections he has well-maintained alignment a little bit of joint space narrowing medially otherwise well maintained joint spaces  No images are attached to the encounter.  Labs: No results found for: "HGBA1C", "ESRSEDRATE", "CRP", "LABURIC", "REPTSTATUS", "GRAMSTAIN", "CULT", "LABORGA"   No results found for: "ALBUMIN", "PREALBUMIN", "CBC"  No results found for: "MG" No results found for: "VD25OH"  No results found for: "PREALBUMIN"    Latest Ref Rng & Units 05/30/2016    4:36 AM 05/19/2016   10:07 AM  CBC EXTENDED  WBC 4.0 - 10.5 K/uL 14.7  7.3   RBC 4.22 - 5.81 MIL/uL 4.61  5.38   Hemoglobin 13.0 - 17.0 g/dL 62.1  30.8   HCT 65.7 - 52.0 % 41.6  48.3   Platelets 150 - 400 K/uL 194  210      There is no height or weight on file to calculate BMI.  Orders:  Orders Placed This Encounter  Procedures   XR Knee 1-2 Views Left   Meds ordered this encounter  Medications   meloxicam (MOBIC) 7.5 MG tablet  Sig: Take 1 tablet (7.5 mg total) by mouth daily.    Dispense:  30 tablet    Refill:  0     Procedures: No procedures performed  Clinical Data: No additional findings.  ROS:  All other systems negative, except as noted in the HPI. Review of Systems  Objective: Vital Signs: There were no vitals taken for this visit.  Specialty Comments:  No specialty comments available.  PMFS History: Patient Active Problem List   Diagnosis Date Noted   Unilateral primary osteoarthritis, left hip 05/29/2016   Status post left hip replacement 05/29/2016   Past Medical History:  Diagnosis Date   Arthritis    Gout    Hypercholesteremia    Hypertension     Family History  Problem Relation Age of Onset   Colon cancer Neg Hx     Past Surgical History:  Procedure Laterality Date   BIOPSY  08/07/2021    Procedure: BIOPSY;  Surgeon: Malissa Hippo, MD;  [facility];  Service: Endoscopy;;   COLONOSCOPY     COLONOSCOPY     COLONOSCOPY N/A 04/02/2016   Procedure: COLONOSCOPY;  Surgeon: Malissa Hippo, MD;  [facility];  Service: Endoscopy;  Laterality: N/A;  1230   COLONOSCOPY WITH PROPOFOL N/A 08/07/2021   Procedure: COLONOSCOPY WITH PROPOFOL;  Surgeon: Malissa Hippo, MD;  [facility];  Service: Endoscopy;  Laterality: N/A;  9:10   POLYPECTOMY  08/07/2021   Procedure: POLYPECTOMY;  Surgeon: Malissa Hippo, MD;  [facility];  Service: Endoscopy;;   TOTAL HIP ARTHROPLASTY Left 05/29/2016   Procedure: LEFT TOTAL HIP ARTHROPLASTY ANTERIOR APPROACH;  Surgeon: Kathryne Hitch, MD;  Location: [facility];  Service: Orthopedics;  Laterality: Left;   VASECTOMY     Social History   Occupational History   Not on file  Tobacco Use   Smoking status: Never   Smokeless tobacco: Former    Quit date: 04/02/1990  Vaping Use   Vaping status: Never Used  Substance and Sexual Activity   Alcohol use: Yes    Comment: occasional   Drug use: No   Sexual activity: Not on file

## 2023-08-26 DIAGNOSIS — Z85828 Personal history of other malignant neoplasm of skin: Secondary | ICD-10-CM | POA: Diagnosis not present

## 2023-08-26 DIAGNOSIS — Z08 Encounter for follow-up examination after completed treatment for malignant neoplasm: Secondary | ICD-10-CM | POA: Diagnosis not present

## 2023-11-02 ENCOUNTER — Ambulatory Visit (INDEPENDENT_AMBULATORY_CARE_PROVIDER_SITE_OTHER): Admitting: Physician Assistant

## 2023-11-02 ENCOUNTER — Other Ambulatory Visit: Payer: Self-pay

## 2023-11-02 ENCOUNTER — Encounter: Payer: Self-pay | Admitting: Physician Assistant

## 2023-11-02 DIAGNOSIS — M25562 Pain in left knee: Secondary | ICD-10-CM | POA: Insufficient documentation

## 2023-11-02 NOTE — Progress Notes (Signed)
 Office Visit Note   Patient: Anthony English           Date of Birth: Dec 20, 1950           MRN: [id number] Visit Date: 11/02/2023              Requested by: Artemisa Bile, MD [id number] [place],  [place] [address] PCP: Artemisa Bile, MD   Assessment & Plan: Visit Diagnoses:  1. Acute pain of left knee     Plan: Patient is a pleasant 73 year old gentleman who I first saw in January with an acute episode of left knee pain.  He denied any problems with his left knee in the past he is very active and enjoys golf.  His x-ray did really not show much in the way of degenerative changes.  He continues to come in today because he has had locking and catching and pain in his posterior knee.  Denies any new injuries.  He has been doing a's exercise program and did feel a little bit better for a while but now is having difficulty exercising.  Would like for him to get an MRI as it has been going on now several months without improvement and he has been doing a exercise plan on a regular basis  Follow-Up Instructions: Will call patient with results  Orders:  Orders Placed This Encounter  Procedures   XR KNEE 3 VIEW LEFT   No orders of the defined types were placed in this encounter.     Procedures: No procedures performed   Clinical Data: No additional findings.   Subjective: No chief complaint on file.   HPI patient is a pleasant 73 year old gentlemanman who I have been following for left knee pain.  He is enjoys golfing and this has been going on since January.  No previous history of knee injury.  He did engage in a regular exercise program to strengthen his knee.  It made him initially feel better but now his knee is gotten bad enough that he has difficulty exercising.  Pain is behind the knee.  Also reports that his knee buckles and gives way  Review of Systems  All other systems reviewed and are negative.    Objective: Vital Signs: There were no vitals taken for this  visit.  Physical Exam Constitutional:      Appearance: Normal appearance.  Pulmonary:     Effort: Pulmonary effort is normal.  Skin:    General: Skin is warm and dry.  Neurological:     General: No focal deficit present.     Mental Status: He is alert and oriented to person, place, and time.  Psychiatric:        Mood and Affect: Mood normal.        Behavior: Behavior normal.     Ortho Exam Examination of his knee he has no crepitus with range of motion no medial lateral patellofemoral joint line tenderness little tenderness deep in the popliteal fossa but cannot appreciate a Baker's cyst.  Compartments are soft and compressible negative Homans' sign no erythema no effusion Specialty Comments:  No specialty comments available.  Imaging: No results found.   PMFS History: Patient Active Problem List   Diagnosis Date Noted   Pain in left knee 11/02/2023   Unilateral primary osteoarthritis, left hip 05/29/2016   Status post left hip replacement 05/29/2016   Past Medical History:  Diagnosis Date   Arthritis    Gout    Hypercholesteremia  Hypertension     Family History  Problem Relation Age of Onset   Colon cancer Neg Hx     Past Surgical History:  Procedure Laterality Date   BIOPSY  08/07/2021   Procedure: BIOPSY;  Surgeon: Ruby Corporal, MD;  [facility];  Service: Endoscopy;;   COLONOSCOPY     COLONOSCOPY     COLONOSCOPY N/A 04/02/2016   Procedure: COLONOSCOPY;  Surgeon: Ruby Corporal, MD;  [facility];  Service: Endoscopy;  Laterality: N/A;  1230   COLONOSCOPY WITH PROPOFOL  N/A 08/07/2021   Procedure: COLONOSCOPY WITH PROPOFOL ;  Surgeon: Ruby Corporal, MD;  [facility];  Service: Endoscopy;  Laterality: N/A;  9:10   POLYPECTOMY  08/07/2021   Procedure: POLYPECTOMY;  Surgeon: Ruby Corporal, MD;  [facility];  Service: Endoscopy;;   TOTAL HIP ARTHROPLASTY Left 05/29/2016   Procedure: LEFT TOTAL HIP ARTHROPLASTY  ANTERIOR APPROACH;  Surgeon: Arnie Lao, MD;  Location: [facility];  Service: Orthopedics;  Laterality: Left;   VASECTOMY     Social History   Occupational History   Not on file  Tobacco Use   Smoking status: Never   Smokeless tobacco: Former    Quit date: 04/02/1990  Vaping Use   Vaping status: Never Used  Substance and Sexual Activity   Alcohol use: Yes    Comment: occasional   Drug use: No   Sexual activity: Not on file

## 2023-11-03 ENCOUNTER — Encounter: Payer: Self-pay | Admitting: Physician Assistant

## 2023-11-08 ENCOUNTER — Ambulatory Visit
Admission: RE | Admit: 2023-11-08 | Discharge: 2023-11-08 | Disposition: A | Discharge status: Home / Self Care | Source: Ambulatory Visit | Attending: Physician Assistant

## 2023-11-08 DIAGNOSIS — M25562 Pain in left knee: Secondary | ICD-10-CM

## 2023-11-18 ENCOUNTER — Encounter: Payer: Self-pay | Admitting: Physician Assistant

## 2023-11-18 ENCOUNTER — Ambulatory Visit (INDEPENDENT_AMBULATORY_CARE_PROVIDER_SITE_OTHER): Admitting: Physician Assistant

## 2023-11-18 DIAGNOSIS — S83242D Other tear of medial meniscus, current injury, left knee, subsequent encounter: Secondary | ICD-10-CM | POA: Diagnosis not present

## 2023-11-18 NOTE — Progress Notes (Signed)
 HPI: Mr. Anthony English returns today to discuss his MRI results of the left knee.  He continues to have posterior knee pain.  Pain sharp at times.  He states any activity causes his pain to increase.  He has tried meloxicam  and Tylenol  without any real relief.  He the pain has been ongoing since January again with no known injury.  MRI images dated 11/08/2023 are reviewed with the patient.  This shows a complex tear of the posterior horn of the medial meniscus.  Mild degenerative chondromalacia medial compartment.  Small effusion.  No ligamentous injuries.  Physical exam: General Well-developed well-nourished male no acute distress.  Ambulates without any assistive device. Left knee.  Full extension full flexion.  Tenderness over the posterior medial aspect of the knee joint line.  No instability valgus varus stressing.  Positive Homans on the left. Left calf supple nontender.  Impression: Left knee medial meniscal tear complex  Plan: Given the patient's MRI findings and the fact that he has failed conservative treatment recommend left knee arthroscopy with debridement and partial medial meniscectomy.  Discussed the postoperative protocol with him.  Risk benefits of surgery discussed with him including infection, nerve vessel injury, DVT PE and possible progression of arthritis.  He will follow-up with us  1 week postop.

## 2023-12-09 ENCOUNTER — Other Ambulatory Visit: Payer: Self-pay | Admitting: Orthopaedic Surgery

## 2023-12-09 DIAGNOSIS — S83232A Complex tear of medial meniscus, current injury, left knee, initial encounter: Secondary | ICD-10-CM | POA: Diagnosis not present

## 2023-12-09 DIAGNOSIS — M948X6 Other specified disorders of cartilage, lower leg: Secondary | ICD-10-CM | POA: Diagnosis not present

## 2023-12-09 DIAGNOSIS — M6752 Plica syndrome, left knee: Secondary | ICD-10-CM | POA: Diagnosis not present

## 2023-12-09 DIAGNOSIS — G8918 Other acute postprocedural pain: Secondary | ICD-10-CM | POA: Diagnosis not present

## 2023-12-09 DIAGNOSIS — M25462 Effusion, left knee: Secondary | ICD-10-CM | POA: Diagnosis not present

## 2023-12-09 DIAGNOSIS — M94262 Chondromalacia, left knee: Secondary | ICD-10-CM | POA: Diagnosis not present

## 2023-12-09 MED ORDER — HYDROCODONE-ACETAMINOPHEN 5-325 MG PO TABS
1.0000 | ORAL_TABLET | Freq: Four times a day (QID) | ORAL | 0 refills | Status: AC | PRN
Start: 2023-12-09 — End: ?

## 2023-12-15 ENCOUNTER — Ambulatory Visit (INDEPENDENT_AMBULATORY_CARE_PROVIDER_SITE_OTHER): Admitting: Orthopaedic Surgery

## 2023-12-15 ENCOUNTER — Encounter: Payer: Self-pay | Admitting: Orthopaedic Surgery

## 2023-12-15 DIAGNOSIS — S83242D Other tear of medial meniscus, current injury, left knee, subsequent encounter: Secondary | ICD-10-CM

## 2023-12-15 DIAGNOSIS — Z9889 Other specified postprocedural states: Secondary | ICD-10-CM

## 2023-12-15 NOTE — Progress Notes (Signed)
 The patient comes in today for his first postoperative visit status post a left knee arthroscopy with a partial medial meniscectomy.  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  He is doing well overall.  I did aspirate 40 cc of bloody fluid from his knee and placed a steroid injection today in his knee which gave him relief.  He has not really been feeling bad but we felt this was the best thing to try for him.  He is an incredibly active 73 year old gentleman.  He can get back to paddling in the water and back in the pool as of today.  He can play golf as well.  He may be a candidate later for hyaluronic acid.  I would like to see him back though in a month just to see how he is doing overall.  We went over his arthroscopy pictures as well.  All question concerns were addressed and answered.

## 2024-01-10 DIAGNOSIS — I10 Essential (primary) hypertension: Secondary | ICD-10-CM | POA: Diagnosis not present

## 2024-01-12 ENCOUNTER — Ambulatory Visit (INDEPENDENT_AMBULATORY_CARE_PROVIDER_SITE_OTHER): Admitting: Orthopaedic Surgery

## 2024-01-12 ENCOUNTER — Telehealth: Payer: Self-pay

## 2024-01-12 ENCOUNTER — Encounter: Payer: Self-pay | Admitting: Orthopaedic Surgery

## 2024-01-12 ENCOUNTER — Other Ambulatory Visit: Payer: Self-pay

## 2024-01-12 DIAGNOSIS — Z9889 Other specified postprocedural states: Secondary | ICD-10-CM

## 2024-01-12 DIAGNOSIS — S83242D Other tear of medial meniscus, current injury, left knee, subsequent encounter: Secondary | ICD-10-CM

## 2024-01-12 NOTE — Telephone Encounter (Signed)
 Left knee gel injection ?

## 2024-01-12 NOTE — Progress Notes (Signed)
 The patient is still in follow-up as a relates to a left knee arthroscopy.  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  His first postoperative visit we aspirated 40 cc of fluid off of the knee and placed a steroid injection in his knee.  He has had some reaccumulation of fluid and continued pain in the left knee and he would like to be more active.  His left knee does have moderate effusion and I was able to aspirate 30 cc of clear yellow fluid from his left knee.  I would like to send him to outpatient physical therapy for any modalities that can help strengthen his left knee.  We will also order hyaluronic acid for his left knee to treat the pain from the arthritis and chondromalacia.  He agrees with this treatment plan.  Hopefully will get him into therapy soon in [place] as well as get a hyaluronic acid injection approved and ordered for his left knee.  He agrees with this treatment plan.  This patient is diagnosed with osteoarthritis of the knee(s).    Radiographs show evidence of joint space narrowing, osteophytes, subchondral sclerosis and/or subchondral cysts.  This patient has knee pain which interferes with functional and activities of daily living.    This patient has experienced inadequate response, adverse effects and/or intolerance with conservative treatments such as acetaminophen , NSAIDS, topical creams, physical therapy or regular exercise, knee bracing and/or weight loss.   This patient has experienced inadequate response or has a contraindication to intra articular steroid injections for at least 3 months.   This patient is not scheduled to have a total knee replacement within 6 months of starting treatment with viscosupplementation.

## 2024-01-14 ENCOUNTER — Ambulatory Visit (HOSPITAL_COMMUNITY): Attending: Orthopaedic Surgery

## 2024-01-14 ENCOUNTER — Other Ambulatory Visit: Payer: Self-pay

## 2024-01-14 DIAGNOSIS — M25562 Pain in left knee: Secondary | ICD-10-CM | POA: Insufficient documentation

## 2024-01-14 DIAGNOSIS — Z9889 Other specified postprocedural states: Secondary | ICD-10-CM | POA: Diagnosis not present

## 2024-01-14 DIAGNOSIS — R262 Difficulty in walking, not elsewhere classified: Secondary | ICD-10-CM | POA: Insufficient documentation

## 2024-01-14 NOTE — Therapy (Signed)
 OUTPATIENT PHYSICAL THERAPY LOWER EXTREMITY EVALUATION   Patient Name: Anthony English MRN: [id number] DOB:12-15-1950, 73 y.o., male Today's Date: 01/14/2024  END OF SESSION:  PT End of Session - 01/14/24 1433     Visit Number 1    Number of Visits 8    Date for PT Re-Evaluation 02/11/24    Authorization Type [employer] state [employer]    PT Start Time 1433    PT Stop Time 1510    PT Time Calculation (min) 37 min    Activity Tolerance Patient tolerated treatment well    Behavior During Therapy [facility] for tasks assessed/performed          Past Medical History:  Diagnosis Date   Arthritis    Gout    Hypercholesteremia    Hypertension    Past Surgical History:  Procedure Laterality Date   BIOPSY  08/07/2021   Procedure: BIOPSY;  Surgeon: Golda Claudis PENNER, MD;  [facility];  Service: Endoscopy;;   COLONOSCOPY     COLONOSCOPY     COLONOSCOPY N/A 04/02/2016   Procedure: COLONOSCOPY;  Surgeon: Claudis PENNER Golda, MD;  [facility];  Service: Endoscopy;  Laterality: N/A;  1230   COLONOSCOPY WITH PROPOFOL  N/A 08/07/2021   Procedure: COLONOSCOPY WITH PROPOFOL ;  Surgeon: Golda Claudis PENNER, MD;  [facility];  Service: Endoscopy;  Laterality: N/A;  9:10   POLYPECTOMY  08/07/2021   Procedure: POLYPECTOMY;  Surgeon: Golda Claudis PENNER, MD;  [facility];  Service: Endoscopy;;   TOTAL HIP ARTHROPLASTY Left 05/29/2016   Procedure: LEFT TOTAL HIP ARTHROPLASTY ANTERIOR APPROACH;  Surgeon: Lonni CINDERELLA Poli, MD;  [facility];  Service: Orthopedics;  Laterality: Left;   VASECTOMY     Patient Active Problem List   Diagnosis Date Noted   Pain in left knee 11/02/2023   Unilateral primary osteoarthritis, left hip 05/29/2016   Status post left hip replacement 05/29/2016    PCP: Sheryle Carwin, MD  REFERRING PROVIDER: Poli Lonni CINDERELLA, MD  REFERRING DIAG:  [phone] (ICD-10-CM) - Status post arthroscopic surgery of left knee    THERAPY DIAG:  Difficulty  in walking, not elsewhere classified  Left knee pain, unspecified chronicity  Rationale for Evaluation and Treatment: Rehabilitation  ONSET DATE: pain started in Jan; s/p 12/09/23  SUBJECTIVE:   SUBJECTIVE STATEMENT: Z98.890 (ICD-10-CM) - Status post arthroscopic surgery of left knee; MMT  Patient woke up with pain in the back of the knee in Jan of this year; x-ray of knee negative; took some tramadol; got a little better but in May pain returned.  Asked for MRI.  Meniscus tear; s/p 12/09/23 initially felt better; had to take fluid off twice since then and pain returned; sometimes just walking in the house feels like he is going to fall.  Talking about considering a hyaluronic acid injection.  HOH he lost his hearing aids.  Now pain is in front of the knee  PERTINENT HISTORY: History of left sciatica prior to left knee pain PAIN:  Are you having pain? Yes: NPRS scale: 3/10 currently; 1/10 at best, 9/10 at worst Pain location: L knee anterior Pain description: wants to give way Aggravating factors: standing, walking Relieving factors: pool  PRECAUTIONS: None  RED FLAGS: None   WEIGHT BEARING RESTRICTIONS: No  FALLS:  Has patient fallen in last 6 months? No    OCCUPATION: retired  PLOF: Independent  PATIENT GOALS: be active; less pain; play golf  NEXT MD VISIT: PRN  OBJECTIVE:  Note: Objective measures were completed at Evaluation unless otherwise noted.  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 His first postoperative visit we aspirated 40 cc of fluid off of the knee and placed a steroid injection in his knee. He has had some reaccumulation of fluid and continued pain in the left knee and he would like to be more active.  PATIENT SURVEYS:  LEFS  Extreme difficulty/unable (0), Quite a bit of difficulty (1), Moderate difficulty (2), Little difficulty  (3), No difficulty (4) Survey date:    Any of your usual work, housework or school activities   2. Usual hobbies, recreational or sporting activities   3. Getting into/out of the bath   4. Walking between rooms   5. Putting on socks/shoes   6. Squatting    7. Lifting an object, like a bag of groceries from the floor   8. Performing light activities around your home   9. Performing heavy activities around your home   10. Getting into/out of a car   11. Walking 2 blocks   12. Walking 1 mile   13. Going up/down 10 stairs (1 flight)   14. Standing for 1 hour   15.  sitting for 1 hour   16. Running on even ground   17. Running on uneven ground   18. Making sharp turns while running fast   19. Hopping    20. Rolling over in bed   Score total:  26/80     COGNITION: Overall cognitive status: Within functional limits for tasks assessed     SENSATION: WFL  EDEMA:  Minimal swelling noted; healing incisions  MUSCLE LENGTH: Hamstrings: tight left > right PALPATION: Next visit  LOWER EXTREMITY ROM:  Active ROM Right eval Left eval  Hip flexion    Hip extension    Hip abduction    Hip adduction    Hip internal rotation    Hip external rotation    Knee flexion 137 130  Knee extension 0 -5  Ankle dorsiflexion    Ankle plantarflexion    Ankle inversion    Ankle eversion     (Blank rows = not tested)  LOWER EXTREMITY MMT:  MMT Right eval Left eval  Hip flexion 4+ 4+  Hip extension    Hip abduction    Hip adduction    Hip internal rotation    Hip external rotation    Knee flexion    Knee extension 5 4  Ankle dorsiflexion 5 5  Ankle plantarflexion    Ankle inversion    Ankle eversion     (Blank rows = not tested)    FUNCTIONAL TESTS:  5 times sit to stand: 23.78 sec using hands to assist up to standing Timed up and go (TUG): 17.86  GAIT: Distance walked: 50 ft in clinic Assistive device utilized: None Level of assistance: Modified  independence Comments: antalgic gait decreased stance left knee  TREATMENT DATE: 01/14/24 physical therapy evaluation and HEP instruction    PATIENT EDUCATION:  Education details: Patient educated on exam findings, POC, scope of PT, HEP, and what to expect next visit. Person educated: Patient Education method: Explanation, Demonstration, and Handouts Education comprehension: verbalized understanding, returned demonstration, verbal cues required, and tactile cues required   HOME EXERCISE PROGRAM: Access Code: [email or web address] URL: [email or web address] Date: 01/14/2024 Prepared by: [facility]  Exercises - quad set with heel elevated  - 2 x daily - 7 x weekly - 1 sets - 10 reps - Supine Hamstring Stretch  - 1 x daily - 7 x weekly - 1 sets - 5 reps - 20 sec hold - Supine Sciatic Nerve Glide  - 1 x daily - 7 x weekly - 1 sets - 10 reps - 10 sec hold - Supine Quad Set  - 1 x daily - 7 x weekly - 1 sets - 10 reps - 5 sec hold  ASSESSMENT:  CLINICAL IMPRESSION: Patient is a 73 y.o. male who was seen today for physical therapy evaluation and treatment for Status post arthroscopic surgery of left knee.   Patient demonstrates muscle weakness, reduced ROM, and fascial restrictions which are likely contributing to symptoms of pain and are negatively impacting patient ability to perform ADLs and functional mobility tasks. Patient will benefit from skilled physical therapy services to address these deficits to reduce pain and improve level of function with ADLs and functional mobility tasks.  .   OBJECTIVE IMPAIRMENTS: Abnormal gait, decreased activity tolerance, decreased mobility, difficulty walking, decreased ROM, decreased strength, and pain.   ACTIVITY LIMITATIONS: carrying, lifting, bending, standing, squatting, stairs, transfers, bed mobility, and locomotion  level  PARTICIPATION LIMITATIONS: meal prep, cleaning, shopping, community activity, and yard work  PERSONAL FACTORS: history of sciatica on the left are also affecting patient's functional outcome.   REHAB POTENTIAL: Good  CLINICAL DECISION MAKING: Evolving/moderate complexity  EVALUATION COMPLEXITY: Moderate   GOALS: Goals reviewed with patient? No  SHORT TERM GOALS: Target date: 01/28/2024 patient will be independent with initial HEP  Baseline: Goal status: INITIAL  2.  Patient will self report 50% improvement to improve tolerance for functional activity  Baseline:  Goal status: INITIAL  LONG TERM GOALS: Target date: 02/11/2024  Patient will be independent in self management strategies to improve quality of life and functional outcomes.  Baseline:  Goal status: INITIAL  2.  Patient will self report 70% improvement to improve tolerance for functional activity  Baseline:  Goal status: INITIAL  3.  Patient will improve LEFS score by 14 points to demonstrate improved perceived function  Baseline: 26/80 Goal status: INITIAL  4. Patient will increase left knee mobility to -2 to 134 to promote normal navigation of steps; step over step pattern  Baseline: see above Goal status: INITIAL  5.   Patient will increase right leg MMT's to 5/5 to allow navigation of steps without gait deviation or loss of balance  Baseline: see above Goal status: INITIAL   PLAN:  PT FREQUENCY: 1-2x/week  PT DURATION: 4 weeks  PLANNED INTERVENTIONS: 97164- PT Re-evaluation, 97110-Therapeutic exercises, 97530- Therapeutic activity, 97112- Neuromuscular re-education, 97535- Self Care, 02859- Manual therapy, [id number]- Gait training, [phone]- Orthotic Fit/training, [phone]- Canalith repositioning, [id number]- Aquatic Therapy, 97760- Splinting, [id number]- Wound care (first 20 sq cm), 97598- Wound care (each additional 20 sq cm)Patient/Family education, Balance training, Stair training, Taping, Dry Needling,  Joint mobilization, Joint manipulation, Spinal manipulation, Spinal mobilization, Scar mobilization, and DME instructions.   PLAN FOR  NEXT SESSION: Review HEP and goals; check hamstring and hip strength on the left; left knee mobility and strength; possible back involvement as well?   3:25 PM, 01/14/24 Anthony English Anthony English MPT [facility] [place] [phone]

## 2024-01-20 NOTE — Telephone Encounter (Signed)
 VOB submitted for Monovisc, left knee

## 2024-02-03 DIAGNOSIS — D225 Melanocytic nevi of trunk: Secondary | ICD-10-CM | POA: Diagnosis not present

## 2024-02-03 DIAGNOSIS — Z1283 Encounter for screening for malignant neoplasm of skin: Secondary | ICD-10-CM | POA: Diagnosis not present

## 2024-02-03 DIAGNOSIS — S80812A Abrasion, left lower leg, initial encounter: Secondary | ICD-10-CM | POA: Diagnosis not present

## 2024-02-03 DIAGNOSIS — X32XXXD Exposure to sunlight, subsequent encounter: Secondary | ICD-10-CM | POA: Diagnosis not present

## 2024-02-03 DIAGNOSIS — L570 Actinic keratosis: Secondary | ICD-10-CM | POA: Diagnosis not present

## 2024-02-03 DIAGNOSIS — L821 Other seborrheic keratosis: Secondary | ICD-10-CM | POA: Diagnosis not present

## 2024-02-09 ENCOUNTER — Encounter (HOSPITAL_COMMUNITY): Admitting: Physical Therapy

## 2024-02-15 ENCOUNTER — Encounter (HOSPITAL_COMMUNITY): Payer: Self-pay

## 2024-02-15 NOTE — Therapy (Signed)
 [facility] at [facility] [address] [place], [place], [address] Phone: [phone]   Fax:  [phone]  Patient Details  Name: Anthony English MRN: [id number] Date of Birth: [birth year] Referring Provider:  No ref. provider found  PHYSICAL THERAPY DISCHARGE SUMMARY  Visits from Start of Care: 1  Current functional level related to goals / functional outcomes: unknown   Remaining deficits: unknown   Education / Equipment: HEP   Patient agrees to discharge. Patient goals were not met. Patient is being discharged due to the patient's request.; he called and states he is much better   [facility] [address] [place], [place], [address] Phone: [phone]   Fax:  [phone]

## 2024-02-16 ENCOUNTER — Encounter (HOSPITAL_COMMUNITY)

## 2024-02-22 ENCOUNTER — Encounter (HOSPITAL_COMMUNITY)

## 2024-02-24 ENCOUNTER — Encounter (HOSPITAL_COMMUNITY)

## 2024-02-29 ENCOUNTER — Encounter (HOSPITAL_COMMUNITY): Admitting: Physical Therapy

## 2024-03-02 ENCOUNTER — Encounter (HOSPITAL_COMMUNITY): Admitting: Physical Therapy

## 2024-03-07 ENCOUNTER — Encounter (HOSPITAL_COMMUNITY): Admitting: Physical Therapy

## 2024-03-09 ENCOUNTER — Encounter (HOSPITAL_COMMUNITY)

## 2024-05-01 ENCOUNTER — Encounter: Payer: Self-pay | Admitting: Radiology
